# Patient Record
Sex: Male | Born: 1965 | Race: Black or African American | Hispanic: No | Marital: Married | Smoking: Current every day smoker
Health system: Southern US, Community
[De-identification: ages and names within clinical notes are randomized; demographics above are authoritative.]

## PROBLEM LIST (undated history)

## (undated) DIAGNOSIS — F1210 Cannabis abuse, uncomplicated: Secondary | ICD-10-CM

## (undated) DIAGNOSIS — F1410 Cocaine abuse, uncomplicated: Secondary | ICD-10-CM

## (undated) DIAGNOSIS — M1990 Unspecified osteoarthritis, unspecified site: Secondary | ICD-10-CM

## (undated) DIAGNOSIS — B20 Human immunodeficiency virus [HIV] disease: Secondary | ICD-10-CM

## (undated) DIAGNOSIS — F1010 Alcohol abuse, uncomplicated: Secondary | ICD-10-CM

---

## 1993-09-09 HISTORY — PX: CEREBRAL ANEURYSM REPAIR: SHX164

## 2008-04-14 ENCOUNTER — Encounter (INDEPENDENT_AMBULATORY_CARE_PROVIDER_SITE_OTHER): Payer: Self-pay | Admitting: Family Medicine

## 2008-04-14 ENCOUNTER — Ambulatory Visit: Payer: Self-pay | Admitting: Internal Medicine

## 2008-04-14 LAB — CONVERTED CEMR LAB
ALT: 21 units/L (ref 0–53)
AST: 15 units/L (ref 0–37)
Albumin: 4.4 g/dL (ref 3.5–5.2)
BUN: 12 mg/dL (ref 6–23)
Basophils Absolute: 0 10*3/uL (ref 0.0–0.1)
CO2: 23 meq/L (ref 19–32)
Calcium: 9.3 mg/dL (ref 8.4–10.5)
Chloride: 104 meq/L (ref 96–112)
Eosinophils Relative: 1 % (ref 0–5)
Glucose, Bld: 71 mg/dL (ref 70–99)
HCV Ab: REACTIVE — AB
HIV 1 RNA Quant: 2310 copies/mL — ABNORMAL HIGH (ref <50)
HIV-1 RNA Quant, Log: 3.36 — ABNORMAL HIGH (ref <1.70)
Lymphs Abs: 2.2 10*3/uL (ref 0.7–4.0)
MCV: 93.4 fL (ref 78.0–100.0)
Neutrophils Relative %: 40 % — ABNORMAL LOW (ref 43–77)
Platelets: 190 10*3/uL (ref 150–400)
RBC: 4.55 M/uL (ref 4.22–5.81)
Total Protein: 7.4 g/dL (ref 6.0–8.3)

## 2008-04-26 ENCOUNTER — Ambulatory Visit: Payer: Self-pay | Admitting: Internal Medicine

## 2008-04-26 LAB — CONVERTED CEMR LAB
CD4 T Helper %: 25 % — ABNORMAL LOW (ref 32–62)
Total lymphocyte count: 3025 cells/mcL (ref 700–3300)

## 2008-04-28 ENCOUNTER — Ambulatory Visit: Payer: Self-pay | Admitting: Internal Medicine

## 2008-05-28 ENCOUNTER — Emergency Department (HOSPITAL_COMMUNITY): Admission: EM | Admit: 2008-05-28 | Discharge: 2008-05-28 | Payer: Self-pay | Admitting: Emergency Medicine

## 2008-06-20 ENCOUNTER — Emergency Department (HOSPITAL_COMMUNITY): Admission: EM | Admit: 2008-06-20 | Discharge: 2008-06-20 | Payer: Self-pay | Admitting: Emergency Medicine

## 2008-07-07 ENCOUNTER — Ambulatory Visit: Payer: Self-pay | Admitting: Internal Medicine

## 2008-07-08 ENCOUNTER — Ambulatory Visit (HOSPITAL_COMMUNITY): Admission: RE | Admit: 2008-07-08 | Discharge: 2008-07-08 | Payer: Self-pay | Admitting: Internal Medicine

## 2008-07-12 ENCOUNTER — Ambulatory Visit: Payer: Self-pay | Admitting: Internal Medicine

## 2008-07-12 LAB — CONVERTED CEMR LAB
AST: 17 units/L (ref 0–37)
BUN: 9 mg/dL (ref 6–23)
Basophils Relative: 1 % (ref 0–1)
CD4 Count: 670 microliters
CD4 T Helper %: 31 % — ABNORMAL LOW (ref 32–62)
CO2: 30 meq/L (ref 19–32)
Calcium: 8.8 mg/dL (ref 8.4–10.5)
Chloride: 104 meq/L (ref 96–112)
Eosinophils Absolute: 0.2 10*3/uL (ref 0.0–0.7)
Eosinophils Relative: 4 % (ref 0–5)
Glucose, Bld: 70 mg/dL (ref 70–99)
HIV 1 RNA Quant: 6620 copies/mL — ABNORMAL HIGH (ref <48)
HIV-1 RNA Quant, Log: 3.82 — ABNORMAL HIGH (ref <1.68)
Lymphocytes Relative: 47 % — ABNORMAL HIGH (ref 12–46)
Lymphs Abs: 2.2 10*3/uL (ref 0.7–4.0)
Monocytes Absolute: 0.4 10*3/uL (ref 0.1–1.0)
Potassium: 4.1 meq/L (ref 3.5–5.3)
RDW: 14.6 % (ref 11.5–15.5)
Sodium: 141 meq/L (ref 135–145)
Total Lymphocytes %: 47 % — ABNORMAL HIGH (ref 12–46)
Total Protein: 7.2 g/dL (ref 6.0–8.3)
WBC: 4.6 10*3/uL (ref 4.0–10.5)

## 2008-07-28 ENCOUNTER — Ambulatory Visit: Payer: Self-pay | Admitting: Internal Medicine

## 2008-09-29 ENCOUNTER — Ambulatory Visit: Payer: Self-pay | Admitting: Internal Medicine

## 2008-09-29 LAB — CONVERTED CEMR LAB
AST: 18 units/L (ref 0–37)
Albumin: 4.2 g/dL (ref 3.5–5.2)
Alkaline Phosphatase: 76 units/L (ref 39–117)
BUN: 13 mg/dL (ref 6–23)
Calcium: 9.9 mg/dL (ref 8.4–10.5)
Chloride: 102 meq/L (ref 96–112)
Creatinine, Ser: 1.12 mg/dL (ref 0.40–1.50)
Eosinophils Relative: 1 % (ref 0–5)
HIV 1 RNA Quant: 4130 copies/mL — ABNORMAL HIGH (ref <48)
HIV-1 RNA Quant, Log: 3.62 — ABNORMAL HIGH (ref <1.68)
Neutro Abs: 1.9 10*3/uL (ref 1.7–7.7)
Potassium: 4.3 meq/L (ref 3.5–5.3)
Total Lymphocytes %: 55 % — ABNORMAL HIGH (ref 12–46)
Total lymphocyte count: 2695 cells/mcL (ref 700–3300)
WBC, lymph enumeration: 4.9 10*3/uL (ref 4.0–10.5)

## 2008-10-03 ENCOUNTER — Ambulatory Visit: Payer: Self-pay | Admitting: *Deleted

## 2008-10-27 ENCOUNTER — Encounter: Payer: Self-pay | Admitting: Internal Medicine

## 2008-11-03 ENCOUNTER — Encounter: Payer: Self-pay | Admitting: Internal Medicine

## 2008-12-05 ENCOUNTER — Ambulatory Visit: Payer: Self-pay | Admitting: Internal Medicine

## 2008-12-05 ENCOUNTER — Encounter: Payer: Self-pay | Admitting: Internal Medicine

## 2008-12-05 DIAGNOSIS — B20 Human immunodeficiency virus [HIV] disease: Secondary | ICD-10-CM | POA: Insufficient documentation

## 2008-12-05 DIAGNOSIS — K0500 Acute gingivitis, plaque induced: Secondary | ICD-10-CM | POA: Insufficient documentation

## 2008-12-05 DIAGNOSIS — M87059 Idiopathic aseptic necrosis of unspecified femur: Secondary | ICD-10-CM | POA: Insufficient documentation

## 2008-12-05 DIAGNOSIS — Z87820 Personal history of traumatic brain injury: Secondary | ICD-10-CM

## 2008-12-05 LAB — CONVERTED CEMR LAB
ALT: 42 units/L (ref 0–53)
AST: 23 units/L (ref 0–37)
Absolute CD4: 754 #/uL (ref 381–1469)
CD4 T Helper %: 27 % — ABNORMAL LOW (ref 32–62)
CO2: 26 meq/L (ref 19–32)
Calcium: 9.2 mg/dL (ref 8.4–10.5)
Chloride: 105 meq/L (ref 96–112)
Creatinine, Ser: 1.17 mg/dL (ref 0.40–1.50)
Eosinophils Absolute: 0.1 10*3/uL (ref 0.0–0.7)
Eosinophils Relative: 3 % (ref 0–5)
HCT: 43.2 % (ref 39.0–52.0)
HIV 1 RNA Quant: 3560 copies/mL — ABNORMAL HIGH (ref <48)
Hemoglobin: 14.9 g/dL (ref 13.0–17.0)
Lymphs Abs: 2.8 10*3/uL (ref 0.7–4.0)
MCHC: 34.5 g/dL (ref 30.0–36.0)
Neutro Abs: 1.6 10*3/uL — ABNORMAL LOW (ref 1.7–7.7)
RDW: 15.3 % (ref 11.5–15.5)
Total Bilirubin: 0.3 mg/dL (ref 0.3–1.2)
Total lymphocyte count: 2793 cells/mcL (ref 700–3300)
WBC, lymph enumeration: 4.9 10*3/uL (ref 4.0–10.5)

## 2008-12-20 ENCOUNTER — Encounter: Payer: Self-pay | Admitting: Internal Medicine

## 2009-01-19 ENCOUNTER — Ambulatory Visit: Payer: Self-pay | Admitting: Internal Medicine

## 2009-01-19 ENCOUNTER — Encounter: Payer: Self-pay | Admitting: Internal Medicine

## 2009-01-19 DIAGNOSIS — K029 Dental caries, unspecified: Secondary | ICD-10-CM | POA: Insufficient documentation

## 2009-01-26 ENCOUNTER — Encounter: Payer: Self-pay | Admitting: Internal Medicine

## 2009-01-26 ENCOUNTER — Ambulatory Visit: Payer: Self-pay | Admitting: Internal Medicine

## 2009-02-02 ENCOUNTER — Ambulatory Visit: Payer: Self-pay | Admitting: Internal Medicine

## 2009-02-02 ENCOUNTER — Encounter: Payer: Self-pay | Admitting: Internal Medicine

## 2009-02-16 ENCOUNTER — Encounter: Payer: Self-pay | Admitting: Internal Medicine

## 2009-03-22 ENCOUNTER — Encounter: Payer: Self-pay | Admitting: Internal Medicine

## 2009-06-12 ENCOUNTER — Ambulatory Visit: Payer: Self-pay | Admitting: Internal Medicine

## 2009-06-12 ENCOUNTER — Encounter: Payer: Self-pay | Admitting: Internal Medicine

## 2009-06-12 LAB — CONVERTED CEMR LAB
ALT: 31 units/L (ref 0–53)
AST: 24 units/L (ref 0–37)
BUN: 13 mg/dL (ref 6–23)
CO2: 25 meq/L (ref 19–32)
Chloride: 106 meq/L (ref 96–112)
Creatinine, Ser: 1.27 mg/dL (ref 0.40–1.50)
Eosinophils Relative: 2 % (ref 0–5)
HCT: 43.8 % (ref 39.0–52.0)
HIV 1 RNA Quant: 595 copies/mL — ABNORMAL HIGH (ref <48)
HIV-1 RNA Quant, Log: 2.77 — ABNORMAL HIGH (ref <1.68)
Platelets: 160 10*3/uL (ref 150–400)
Potassium: 4.2 meq/L (ref 3.5–5.3)
RDW: 15 % (ref 11.5–15.5)
Total Bilirubin: 0.3 mg/dL (ref 0.3–1.2)
Total Protein: 8.1 g/dL (ref 6.0–8.3)
Total lymphocyte count: 2907 cells/mcL (ref 700–3300)

## 2009-06-13 ENCOUNTER — Telehealth: Payer: Self-pay | Admitting: Internal Medicine

## 2009-06-27 ENCOUNTER — Encounter: Payer: Self-pay | Admitting: Internal Medicine

## 2009-07-25 ENCOUNTER — Encounter: Payer: Self-pay | Admitting: Internal Medicine

## 2009-07-31 ENCOUNTER — Encounter: Payer: Self-pay | Admitting: Internal Medicine

## 2009-07-31 ENCOUNTER — Ambulatory Visit: Payer: Self-pay | Admitting: Internal Medicine

## 2009-10-09 ENCOUNTER — Emergency Department (HOSPITAL_COMMUNITY): Admission: EM | Admit: 2009-10-09 | Discharge: 2009-10-09 | Payer: Self-pay | Admitting: Emergency Medicine

## 2010-04-03 ENCOUNTER — Ambulatory Visit: Payer: Self-pay | Admitting: Internal Medicine

## 2010-04-03 LAB — CONVERTED CEMR LAB
ALT: 14 units/L (ref 0–53)
AST: 17 units/L (ref 0–37)
Alkaline Phosphatase: 66 units/L (ref 39–117)
Basophils Absolute: 0 10*3/uL (ref 0.0–0.1)
Basophils Relative: 1 % (ref 0–1)
Calcium: 9 mg/dL (ref 8.4–10.5)
Hemoglobin: 13.2 g/dL (ref 13.0–17.0)
Lymphocytes Relative: 48 % — ABNORMAL HIGH (ref 12–46)
MCHC: 34.1 g/dL (ref 30.0–36.0)
MCV: 93.9 fL (ref 78.0–100.0)
Monocytes Relative: 9 % (ref 3–12)
Neutrophils Relative %: 40 % — ABNORMAL LOW (ref 43–77)
Platelets: 137 10*3/uL — ABNORMAL LOW (ref 150–400)
RBC: 4.12 M/uL — ABNORMAL LOW (ref 4.22–5.81)
RDW: 16.1 % — ABNORMAL HIGH (ref 11.5–15.5)
Total Protein: 7.3 g/dL (ref 6.0–8.3)

## 2010-10-09 NOTE — Miscellaneous (Signed)
Summary: Orders Update  Clinical Lists Changes  Orders: Added new Test order of T-CBC w/Diff [phone]) - Signed Added new Test order of T-CD4SP [facility]) (CD4SP) - Signed Added new Test order of T-Comprehensive Metabolic Panel [phone]) - Signed Added new Test order of T-HIV Viral Load [phone]) - Signed     Process Orders Check Orders Results:     [facility]: Check successful Tests Sent for requisitioning (April 03, 2010 9:11 AM):     04/03/2010: [facility] -- T-CBC w/Diff [[id number]] (signed)     04/03/2010: [facility] -- T-Comprehensive Metabolic Panel [[id number]] (signed)     04/03/2010: [facility] -- T-HIV Viral Load [phone] (signed)

## 2010-10-09 NOTE — Miscellaneous (Signed)
Summary: HIPAA Restrictions  HIPAA Restrictions   Imported By: Florinda Marker 04/03/2010 10:10:19  _____________________________________________________________________  External Attachment:    Type:   Image     Comment:   External Document

## 2010-11-24 LAB — T-HELPER CELL (CD4) - (RCID CLINIC ONLY)
CD4 % Helper T Cell: 29 % — ABNORMAL LOW (ref 33–55)
CD4 T Cell Abs: 530 uL (ref 400–2700)

## 2011-02-20 ENCOUNTER — Other Ambulatory Visit: Payer: Self-pay | Admitting: Adult Health

## 2011-02-20 DIAGNOSIS — B20 Human immunodeficiency virus [HIV] disease: Secondary | ICD-10-CM

## 2011-02-21 ENCOUNTER — Other Ambulatory Visit (INDEPENDENT_AMBULATORY_CARE_PROVIDER_SITE_OTHER): Payer: Medicare Other

## 2011-02-21 ENCOUNTER — Other Ambulatory Visit: Payer: Self-pay | Admitting: Infectious Diseases

## 2011-02-21 DIAGNOSIS — B20 Human immunodeficiency virus [HIV] disease: Secondary | ICD-10-CM

## 2011-02-21 DIAGNOSIS — Z113 Encounter for screening for infections with a predominantly sexual mode of transmission: Secondary | ICD-10-CM

## 2011-02-22 LAB — CBC WITH DIFFERENTIAL/PLATELET
Basophils Absolute: 0 10*3/uL (ref 0.0–0.1)
Basophils Relative: 1 % (ref 0–1)
Eosinophils Absolute: 0.2 10*3/uL (ref 0.0–0.7)
Eosinophils Relative: 3 % (ref 0–5)
HCT: 42.6 % (ref 39.0–52.0)
MCV: 93 fL (ref 78.0–100.0)
Monocytes Absolute: 0.4 10*3/uL (ref 0.1–1.0)
Monocytes Relative: 7 % (ref 3–12)
Neutrophils Relative %: 48 % (ref 43–77)

## 2011-02-22 LAB — COMPREHENSIVE METABOLIC PANEL
BUN: 17 mg/dL (ref 6–23)
Calcium: 9.9 mg/dL (ref 8.4–10.5)
Chloride: 107 mEq/L (ref 96–112)
Creat: 0.9 mg/dL (ref 0.50–1.35)
Glucose, Bld: 82 mg/dL (ref 70–99)
Potassium: 4.3 mEq/L (ref 3.5–5.3)
Total Bilirubin: 0.3 mg/dL (ref 0.3–1.2)

## 2011-02-22 LAB — T-HELPER CELL (CD4) - (RCID CLINIC ONLY): CD4 T Cell Abs: 700 uL (ref 400–2700)

## 2011-02-22 LAB — GC/CHLAMYDIA PROBE AMP, URINE: GC Probe Amp, Urine: NEGATIVE

## 2011-02-23 LAB — HIV-1 RNA QUANT-NO REFLEX-BLD
HIV 1 RNA Quant: 6480 copies/mL — ABNORMAL HIGH (ref <20)
HIV-1 RNA Quant, Log: 3.81 {Log} — ABNORMAL HIGH (ref <1.30)

## 2011-03-07 ENCOUNTER — Ambulatory Visit: Payer: Self-pay | Admitting: Adult Health

## 2011-03-22 ENCOUNTER — Ambulatory Visit (INDEPENDENT_AMBULATORY_CARE_PROVIDER_SITE_OTHER): Payer: Medicare Other | Admitting: Adult Health

## 2011-03-22 ENCOUNTER — Encounter: Payer: Self-pay | Admitting: Adult Health

## 2011-03-22 DIAGNOSIS — Z79899 Other long term (current) drug therapy: Secondary | ICD-10-CM

## 2011-03-22 DIAGNOSIS — I10 Essential (primary) hypertension: Secondary | ICD-10-CM

## 2011-03-22 DIAGNOSIS — B20 Human immunodeficiency virus [HIV] disease: Secondary | ICD-10-CM

## 2011-03-22 DIAGNOSIS — N390 Urinary tract infection, site not specified: Secondary | ICD-10-CM

## 2011-03-22 DIAGNOSIS — R636 Underweight: Secondary | ICD-10-CM

## 2011-03-22 DIAGNOSIS — Z113 Encounter for screening for infections with a predominantly sexual mode of transmission: Secondary | ICD-10-CM

## 2011-03-22 LAB — URINALYSIS, ROUTINE W REFLEX MICROSCOPIC
Protein, ur: NEGATIVE mg/dL
Specific Gravity, Urine: 1.026 (ref 1.005–1.030)
pH: 6 (ref 5.0–8.0)

## 2011-03-22 LAB — LIPID PANEL: VLDL: 28 mg/dL (ref 0–40)

## 2011-03-22 MED ORDER — CIPROFLOXACIN HCL 500 MG PO TABS: 500.0000 mg | ORAL_TABLET | Freq: Two times a day (BID) | ORAL | Status: AC

## 2011-03-22 MED ORDER — ENSURE PLUS PO LIQD: 1.0000 | Freq: Three times a day (TID) | ORAL | Status: DC

## 2011-03-22 NOTE — Progress Notes (Signed)
Subjective:    Patient ID: Michael [name], male    DOB: 10/25/1965, 45 y.o.   MRN: [id number]  HPI Presents to clinic for followup for the first time in nearly one year. Antiretroviral nave by history, he relates no known HIV associated sequela. 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 He has not followed up with orthopedics since last year as well. Claims that the pain seems to be worsening with increased difficulty in weightbearing, ambulation. Also, complains of dysuria, and increased urinary frequency, but only voiding small amounts. Denies frank hematuria, flank pain, or lower abdominal pain. Also reports ongoing problems with anorexia, which he also relates has been present. As long as he has had hip pain.  Review of Systems  Constitutional: Positive for activity change and appetite change. Negative for fever, chills, diaphoresis, fatigue and unexpected weight change.  HENT: Negative for hearing loss, ear pain, nosebleeds, congestion, sore throat, facial swelling, rhinorrhea, sneezing, drooling, mouth sores, trouble swallowing, neck pain, neck stiffness, dental problem, voice change, postnasal drip, sinus pressure, tinnitus and ear discharge.   Eyes: Negative for photophobia, pain, discharge, redness, itching and visual disturbance.  Respiratory: Negative for apnea, cough, choking, chest tightness, shortness of breath, wheezing and stridor.   Cardiovascular: Negative for chest pain, palpitations and leg swelling.  Gastrointestinal: Negative for nausea, vomiting, abdominal pain, diarrhea, constipation, blood in stool, abdominal distention, anal bleeding and rectal pain.  Genitourinary: Positive for dysuria, urgency, frequency and decreased urine volume. Negative for hematuria, flank pain, discharge, penile swelling, scrotal swelling, enuresis, difficulty urinating, genital sores, penile pain and testicular  pain.  Musculoskeletal: Positive for myalgias, arthralgias and gait problem. Negative for back pain and joint swelling.  Skin: Negative for color change, pallor, rash and wound.  Neurological: Negative for dizziness, tremors, seizures, syncope, facial asymmetry, speech difficulty, weakness, light-headedness, numbness and headaches.  Hematological: Negative for adenopathy. Does not bruise/bleed easily.  Psychiatric/Behavioral: Negative for suicidal ideas, hallucinations, behavioral problems, confusion, sleep disturbance, self-injury, dysphoric mood, decreased concentration and agitation. The patient is not nervous/anxious and is not hyperactive.        Objective:   Physical Exam  Constitutional: He is oriented to person, place, and time. He appears well-developed. No distress.       Underweight-appearing  HENT:  Head: Normocephalic and atraumatic.  Right Ear: External ear normal.  Left Ear: External ear normal.  Nose: Nose normal.  Mouth/Throat: No oropharyngeal exudate.  Eyes: Conjunctivae and EOM are normal. Pupils are equal, round, and reactive to light. Right eye exhibits no discharge. Left eye exhibits no discharge. No scleral icterus.  Neck: Normal range of motion. Neck supple. No JVD present. No tracheal deviation present. No thyromegaly present.  Cardiovascular: Normal rate, regular rhythm, normal heart sounds and intact distal pulses.   Pulmonary/Chest: Effort normal and breath sounds normal. No stridor. No respiratory distress. He has no wheezes. He has no rales. He exhibits no tenderness.  Abdominal: Soft. Bowel sounds are normal. He exhibits no distension and no mass. There is no tenderness. There is no rebound and no guarding.  Genitourinary: Rectum normal and penis normal. No penile tenderness.  Musculoskeletal: He exhibits tenderness. He exhibits no edema.       Limited flexion and abduction to left hip. Ambulates with limited weightbearing, favoring the left side, and using a  cane.  Lymphadenopathy:    He has no cervical adenopathy.  Neurological: He is  alert and oriented to person, place, and time. No cranial nerve deficit. He exhibits normal muscle tone. Coordination normal.  Skin: Skin is warm. No rash noted. He is not diaphoretic. No erythema. No pallor.  Psychiatric: He has a normal mood and affect. His behavior is normal. Judgment and thought content normal.          Assessment & Plan:  1. HIV. Labs obtained 02/21/2011. Show a CD4 count of 700 at 28% with a viral load of 6480 copies per mL. While his CD4 count continues to remain above 500, he does demonstrate, albeit small, a continued presence of viral replication. Given his current history that also includes AVN of the hip, we should discuss in detail, perhaps, measures that would include starting antiretroviral therapy. Reasoning/rationale for early initiation of therapy, and the relative to many factors, as was discussed with him, including some of the present. Comorbidities that he currently has, as well as the fact he is in a discordant relationship with his wife. We should obtain an HIV genotype today as well as obtaining lipids for baseline evaluation before discussing possible treatment options for him. He seemed quite receptive to the possibility of treatment and was willing to engage in conversation regarding this. On followup. We will ask that he return to clinic in 2 weeks for further discussion.  2. UTI. Urine dipstick showed leukocytes and blood with a protein of 30 in the urine. We will send his urine sample to obtain a formal UA and urine C&S, as well as a GC and Chlamydia. Meanwhile, we will treat him with Cipro 500 mg by mouth every 12 hours x10 days. Also, instructed him to increase intake of cranberry juice, and other fluids.  3. AVN, Left Hip. He was instructed to contact his orthopedist for a followup evaluation and possible evaluation for surgical intervention. I discussed with him that if  his symptoms continue to worsen and no intervention is done, he may eventually become wheelchair-bound.  4. Anorexia/Underweight Appearance. Ensure one can by mouth 3 times a day between meals.  He verbally acknowledged all information was provided to him and agreed with plan of care.

## 2011-03-23 LAB — URINALYSIS, MICROSCOPIC ONLY
Bacteria, UA: NONE SEEN
Squamous Epithelial / LPF: NONE SEEN

## 2011-03-25 LAB — HIV-1 RNA ULTRAQUANT REFLEX TO GENTYP+: HIV 1 RNA Quant: 8620 copies/mL — ABNORMAL HIGH (ref <20)

## 2011-04-02 LAB — HIV-1 GENOTYPR PLUS

## 2011-04-05 ENCOUNTER — Ambulatory Visit: Payer: Medicare Other | Admitting: Adult Health

## 2011-05-06 ENCOUNTER — Encounter: Payer: Self-pay | Admitting: Adult Health

## 2011-05-06 ENCOUNTER — Ambulatory Visit: Payer: Medicare Other | Admitting: Adult Health

## 2011-05-06 ENCOUNTER — Ambulatory Visit (INDEPENDENT_AMBULATORY_CARE_PROVIDER_SITE_OTHER): Payer: Medicare Other | Admitting: Adult Health

## 2011-05-06 VITALS — BP 117/73 | HR 66 | Temp 97.8°F | Wt 159.0 lb

## 2011-05-06 DIAGNOSIS — K069 Disorder of gingiva and edentulous alveolar ridge, unspecified: Secondary | ICD-10-CM

## 2011-05-06 DIAGNOSIS — B20 Human immunodeficiency virus [HIV] disease: Secondary | ICD-10-CM

## 2011-05-06 DIAGNOSIS — L259 Unspecified contact dermatitis, unspecified cause: Secondary | ICD-10-CM

## 2011-05-06 DIAGNOSIS — Z23 Encounter for immunization: Secondary | ICD-10-CM

## 2011-05-06 DIAGNOSIS — K056 Periodontal disease, unspecified: Secondary | ICD-10-CM

## 2011-05-06 MED ORDER — KETOCONAZOLE 2 % EX CREA: TOPICAL_CREAM | Freq: Two times a day (BID) | CUTANEOUS | Status: AC

## 2011-05-06 MED ORDER — HYDROCORTISONE 1 % EX CREA: TOPICAL_CREAM | CUTANEOUS | Status: AC

## 2011-05-06 MED ORDER — EMTRICITAB-RILPIVIR-TENOFOV DF 200-25-300 MG PO TABS: 1.0000 | ORAL_TABLET | Freq: Every day | ORAL | Status: DC

## 2011-05-06 MED ORDER — CHLORHEXIDINE GLUCONATE 0.12 % MT SOLN: OROMUCOSAL | Status: DC

## 2011-05-06 NOTE — Patient Instructions (Signed)
1. Take Complera one tablet by mouth daily, with a 400-600-calorie meal. 2. Return to clinic in 4 weeks for repeat labs. 3. Scheduled followup visit in 6 weeks. 4. Do not eat or drink anything for 30 minutes after using mouth wash. 5. Wash clothes in fragrance-free and dye-free detergent. 6. Wear cotton clothing not synthetic fibers.  Gingivitis Gingivitis is a form of gum (periodontal) disease that causes swelling (inflammation) of your gums. Gingivitis means there is an inflammation (soreness) of the gingiva. The gingiva is the soft tissue around the tooth and attached to the sides of the tooth. When these tissues become inflamed, they bleed easily, especially during flossing or brushing. The gums may also become tender and inflamed (red and sore). This may cause bad breath. Continued infection around the tooth can eventually cause cavities and loosening of teeth, with the eventual loss of teeth. The cause of this disease is plaque which is a build up of bacteria (germs) and sugar. As plaque builds up, it reacts with the saliva in your mouth to form something called calculus around the tooth. This substance is irritating to the gums and causes the gingivitis. When this disease process is allowed to continue it eventually causes cavities in teeth and loss of teeth. Prevention of this problem is the best treatment. Regular dental check ups and cleaning help with prevention. PREVENTION  Brush at least twice a day and floss at least once per day. Ideally you should brush following each meal. When flossing, it is best to floss first, then brush.   Limit the sugars eaten between meals.   If your infant sleeps with a bottle, use only water. Juices and sugars while sleeping help promote tooth decay and gingivitis.   Even the best dental hygiene will not prevent plaque from developing. It is necessary for you to see your dental hygienist or dentist on a regular basis as suggested by them for cleaning and  regular checkups. As noted above, keeping your teeth free of plaque will help prevent gingivitis and cavities.   Your dental caregivers can recommend proper oral hygiene and mouth care for you to follow, as well as suggest special tooth pastes.  SEEK DENTAL AND OR MEDICAL CARE IF:  You have painful, reddened tissue around your teeth   You have difficulty chewing.   You notice any loose or infected teeth.  Document Released: 02/19/2001 Document Re-Released: 11/22/2008 [facility] Patient Information 2011 [place], [place].  Contact Dermatitis Contact dermatitis is a reaction to certain substances that touch the skin. The substances may irritate the skin or cause an allergic reaction. Many substances can cause contact dermatitis. Common causes are cosmetics, jewelry, soaps, solvents and any number of chemicals. The area of skin that is exposed becomes dry, red, cracked, and itchy, and looks like a rash. In severe cases, blisters may develop. Symptoms (problems) can be controlled with treatment and by avoiding substances that caused the reaction. TREATMENT AND PREVENTION MEASURES  Keep the area of skin that is affected away from hot water, soap, sunlight, chemicals, acidic substances, or anything else that would irritate your skin. Do not rub the skin.   Use medications as directed.   You may use:   A topical steroid to reduce inflammation (redness or soreness) and anti-bacterial (germ) ointments for secondary infections.   Lubricants to keep moisture in your skin.   Burrow's solution to reduce inflammation or dry rash if weeping. Mix one packet or tablet in 2 cups cool water. Dip a clean  washcloth in the mixture, wring it out a bit, and put it on the affected area. Leave in place for 30 minutes, then re-soak the washcloth and apply again. Do this as often as possible throughout the day.   If the area is too large to cover with a wash cloth, take several cornstarch or baking soda baths daily.     You may want to rest affected areas until less sore.  SEEK IMMEDIATE MEDICAL CARE IF:  An oral temperature above 102 develops.   You see signs of infection, such as swelling, tenderness, inflammation (redness or soreness), or warmth of affected area.   Treatment does not relieve your symptoms within 2 days, or as suggested by your caregiver.   You have any problems related to medication used.  Document Released: 08/23/2000 Document Re-Released: 02/13/2010 [facility] Patient Information 2011 [place], [place].

## 2011-06-04 ENCOUNTER — Emergency Department: Payer: Self-pay | Admitting: Emergency Medicine

## 2011-06-10 ENCOUNTER — Other Ambulatory Visit: Payer: Self-pay | Admitting: *Deleted

## 2011-06-10 DIAGNOSIS — B20 Human immunodeficiency virus [HIV] disease: Secondary | ICD-10-CM

## 2011-06-10 MED ORDER — EMTRICITAB-RILPIVIR-TENOFOV DF 200-25-300 MG PO TABS: 1.0000 | ORAL_TABLET | Freq: Every day | ORAL | Status: DC

## 2011-06-10 NOTE — Telephone Encounter (Signed)
Patient called and advised he now has his ADAP and is using [facility] and to send his meds there.

## 2011-06-11 LAB — URINALYSIS, ROUTINE W REFLEX MICROSCOPIC
Protein, ur: NEGATIVE
Urobilinogen, UA: 0.2

## 2011-07-18 ENCOUNTER — Other Ambulatory Visit: Payer: Medicare Other

## 2011-08-13 ENCOUNTER — Other Ambulatory Visit: Payer: Self-pay | Admitting: Internal Medicine

## 2011-08-13 DIAGNOSIS — B20 Human immunodeficiency virus [HIV] disease: Secondary | ICD-10-CM

## 2011-08-15 ENCOUNTER — Ambulatory Visit: Payer: Medicare Other | Admitting: Internal Medicine

## 2011-08-15 ENCOUNTER — Other Ambulatory Visit: Payer: Medicare Other

## 2011-08-28 ENCOUNTER — Other Ambulatory Visit: Payer: Medicare Other

## 2011-08-29 ENCOUNTER — Ambulatory Visit: Payer: Medicare Other | Admitting: Internal Medicine

## 2011-09-12 ENCOUNTER — Telehealth: Payer: Self-pay | Admitting: *Deleted

## 2011-09-12 ENCOUNTER — Encounter: Payer: Medicare Other | Admitting: Internal Medicine

## 2011-09-12 NOTE — Telephone Encounter (Signed)
Phoned patient to inform him he missed his visit with Dr. Drue Second. Was unable to reach him or leave a message due to mailbox being full. Tacey Heap RN

## 2011-09-25 ENCOUNTER — Other Ambulatory Visit (INDEPENDENT_AMBULATORY_CARE_PROVIDER_SITE_OTHER): Payer: Medicare Other

## 2011-09-25 ENCOUNTER — Other Ambulatory Visit: Payer: Self-pay | Admitting: Infectious Diseases

## 2011-09-25 DIAGNOSIS — B20 Human immunodeficiency virus [HIV] disease: Secondary | ICD-10-CM

## 2011-09-25 LAB — CBC WITH DIFFERENTIAL/PLATELET
Basophils Absolute: 0 10*3/uL (ref 0.0–0.1)
Eosinophils Absolute: 0.1 10*3/uL (ref 0.0–0.7)
Lymphocytes Relative: 48 % — ABNORMAL HIGH (ref 12–46)
Lymphs Abs: 2.7 10*3/uL (ref 0.7–4.0)
Neutrophils Relative %: 43 % (ref 43–77)
Platelets: 234 10*3/uL (ref 150–400)
RBC: 4.33 MIL/uL (ref 4.22–5.81)
WBC: 5.6 10*3/uL (ref 4.0–10.5)

## 2011-09-25 LAB — COMPREHENSIVE METABOLIC PANEL
ALT: 21 U/L (ref 0–53)
AST: 27 U/L (ref 0–37)
CO2: 23 mEq/L (ref 19–32)
Chloride: 106 mEq/L (ref 96–112)
Sodium: 137 mEq/L (ref 135–145)
Total Bilirubin: 0.4 mg/dL (ref 0.3–1.2)
Total Protein: 7 g/dL (ref 6.0–8.3)

## 2011-09-26 LAB — T-HELPER CELL (CD4) - (RCID CLINIC ONLY): CD4 T Cell Abs: 910 uL (ref 400–2700)

## 2011-09-27 LAB — HIV-1 RNA QUANT-NO REFLEX-BLD
HIV 1 RNA Quant: <20 copies/mL (ref <20)
HIV-1 RNA Quant, Log: <1.3 {Log} (ref <1.30)

## 2011-10-09 ENCOUNTER — Ambulatory Visit: Payer: Medicare Other | Admitting: Infectious Disease

## 2011-10-10 ENCOUNTER — Other Ambulatory Visit: Payer: Self-pay | Admitting: Infectious Disease

## 2011-10-10 ENCOUNTER — Encounter: Payer: Self-pay | Admitting: Infectious Disease

## 2011-10-10 ENCOUNTER — Ambulatory Visit (INDEPENDENT_AMBULATORY_CARE_PROVIDER_SITE_OTHER): Payer: Medicare Other | Admitting: Infectious Disease

## 2011-10-10 VITALS — BP 100/65 | HR 64 | Temp 97.9°F | Wt 150.0 lb

## 2011-10-10 DIAGNOSIS — B079 Viral wart, unspecified: Secondary | ICD-10-CM | POA: Insufficient documentation

## 2011-10-10 DIAGNOSIS — B20 Human immunodeficiency virus [HIV] disease: Secondary | ICD-10-CM

## 2011-10-10 DIAGNOSIS — E559 Vitamin D deficiency, unspecified: Secondary | ICD-10-CM

## 2011-10-10 DIAGNOSIS — M87059 Idiopathic aseptic necrosis of unspecified femur: Secondary | ICD-10-CM

## 2011-10-10 MED ORDER — IMIQUIMOD 5 % EX CREA: TOPICAL_CREAM | CUTANEOUS | Status: DC

## 2011-10-10 MED ORDER — ALENDRONATE SODIUM 70 MG PO TABS: 70.0000 mg | ORAL_TABLET | ORAL | Status: DC

## 2011-10-10 MED ORDER — OXYCODONE-ACETAMINOPHEN 10-325 MG PO TABS: 1.0000 | ORAL_TABLET | Freq: Every evening | ORAL | Status: AC | PRN

## 2011-10-10 MED ORDER — CALCIUM CARBONATE-VITAMIN D 600-400 MG-UNIT PO CHEW: 1.0000 | CHEWABLE_TABLET | Freq: Every day | ORAL | Status: DC

## 2011-10-10 NOTE — Assessment & Plan Note (Signed)
Continue complera 

## 2011-10-10 NOTE — Progress Notes (Signed)
  Subjective:    Patient ID: Michael [name], male    DOB: August 07, 1966, 46 y.o.   MRN: [id number]  HPI  Michael [name] is a 46 y.o. male who is doing superbly well on his antiviral regimen, with undetectable viral load and health cd4 count. He is on complera and understands ALL of requirements in terms of diet, avoidance of antacids etc. 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 He requests med for pain control. I spent greater than 45 minutes with the patient including greater than 50% of time in face to face counsel of the patient and in coordination of their care.    Review of Systems  Constitutional: Negative for fever, chills, diaphoresis, activity change, appetite change, fatigue and unexpected weight change.  HENT: Negative for congestion, sore throat, rhinorrhea, sneezing, trouble swallowing and sinus pressure.   Eyes: Negative for photophobia and visual disturbance.  Respiratory: Negative for cough, chest tightness, shortness of breath, wheezing and stridor.   Cardiovascular: Negative for chest pain, palpitations and leg swelling.  Gastrointestinal: Negative for nausea, vomiting, abdominal pain, diarrhea, constipation, blood in stool, abdominal distention and anal bleeding.  Genitourinary: Negative for dysuria, hematuria, flank pain and difficulty urinating.  Musculoskeletal: Positive for myalgias, arthralgias and gait problem. Negative for back pain and joint swelling.  Skin: Negative for color change, pallor, rash and wound.  Neurological: Negative for dizziness, tremors, weakness and light-headedness.  Hematological: Negative for adenopathy. Does not bruise/bleed easily.  Psychiatric/Behavioral: Negative for behavioral problems, confusion, sleep disturbance, dysphoric mood, decreased concentration and agitation.       Objective:   Physical Exam  Constitutional: He is oriented to person, place, and time. He appears  well-developed and well-nourished. No distress.  HENT:  Head: Normocephalic and atraumatic.  Mouth/Throat: Oropharynx is clear and moist. No oropharyngeal exudate.  Eyes: Conjunctivae and EOM are normal. Pupils are equal, round, and reactive to light. No scleral icterus.  Neck: Normal range of motion. Neck supple. No JVD present.  Cardiovascular: Normal rate, regular rhythm and normal heart sounds.  Exam reveals no gallop and no friction rub.   No murmur heard. Pulmonary/Chest: Effort normal and breath sounds normal. No respiratory distress. He has no wheezes. He has no rales. He exhibits no tenderness.  Abdominal: He exhibits no distension and no mass. There is no tenderness. There is no rebound and no guarding.  Musculoskeletal: He exhibits no edema and no tenderness.       Right hip: He exhibits tenderness.  Lymphadenopathy:    He has no cervical adenopathy.  Neurological: He is alert and oriented to person, place, and time. He has normal reflexes. He exhibits normal muscle tone. Coordination normal.  Skin: Skin is warm and dry. He is not diaphoretic. No erythema. No pallor.  Psychiatric: He has a normal mood and affect. His behavior is normal. Judgment and thought content normal.          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 WIll put him on fosamax, vitamin D and calcium. Will check testosterone levels

## 2011-10-10 NOTE — Assessment & Plan Note (Signed)
Due to steroids AND HIV likely. WIll put him on fosamax, vitamin D and calcium. Will check testosterone levels

## 2011-10-21 DIAGNOSIS — L259 Unspecified contact dermatitis, unspecified cause: Secondary | ICD-10-CM | POA: Insufficient documentation

## 2011-10-21 DIAGNOSIS — K056 Periodontal disease, unspecified: Secondary | ICD-10-CM | POA: Insufficient documentation

## 2011-10-21 NOTE — Assessment & Plan Note (Signed)
Will try chlorhexidine gluconate 0.12% swish and spit twice a day and make a referral to see a dentist.

## 2011-10-21 NOTE — Progress Notes (Signed)
Subjective:    Patient ID: Michael [name] is a 46 y.o. male.  Chief Complaint: HIV Follow-up Visit Michael [name] is here for follow-up of HIV infection. He is feeling unchanged since his last visit.  He claims continued adherence to therapy with good tolerance and no complications. There are additional complaints. Complains of rash on hands, and arms. Also, complains of oropharyngeal pain, and discomfort.  Data Review: Diagnostic studies reviewed.  Review of Systems - General ROS: negative Psychological ROS: negative Ophthalmic ROS: negative ENT ROS: positive for - as per history of present illness Respiratory ROS: no cough, shortness of breath, or wheezing Cardiovascular ROS: no chest pain or dyspnea on exertion Gastrointestinal ROS: no abdominal pain, change in bowel habits, or black or bloody stools Musculoskeletal ROS: negative Neurological ROS: no TIA or stroke symptoms Dermatological ROS: negative for rash  Objective:   General appearance: alert, cooperative and no distress Head: Normocephalic, without obvious abnormality, atraumatic Eyes: conjunctivae/corneas clear. PERRL, EOM's intact. Fundi benign. Ears: normal TM's and external ear canals both ears Throat: Poor dentition, with significant gingival hyperplasia and erythema at the gingival line. Resp: clear to auscultation bilaterally Cardio: regular rate and rhythm, S1, S2 normal, no murmur, click, rub or gallop GI: soft, non-tender; bowel sounds normal; no masses,  no organomegaly Skin: Macrotrabecular rash noted to hands, and arms. Neurologic: Grossly normal Psych:  No vegetative signs or delusional behaviors noted.    Laboratory: From 02/21/2011 ,  CD4 count was 700 c/cmm @ 28 %. Viral load 6480 copies/ml. From 03/22/2011. HIV genotype was pansensitive are consistent with wild-type virus.     Assessment/Plan:   Contact dermatitis Will try hydrocortisone 1% cream, and ketoconazole 2% cream to be applied twice a  day to affected areas.  Periodontal disease Will try chlorhexidine gluconate 0.12% swish and spit twice a day and make a referral to see a dentist.  HIV DISEASE He has been treatment nave for his HIV, up until now. We discussed in detail the changes that have been made in treatment guidelines and that treatment is recommended for all individuals with a detectable viral load. Regardless a CD4 count. After discussing this, he understood the need to begin therapy knowing that it had nothing to do with any and immunologic deterioration. We reviewed briefly HIV. Pathogenesis, and the various treatment regimens. After careful discussion it was mutually agreed upon to begin Complera therapy.  Medication regimen,drug effects, treatment limitations, side effects, ADRs, and potential toxicities discussed in detail. Medication adherence, its importance in therapy and resistance discussed with patient. Counseling provided on prevention of transmission of HIV. Condoms offered:  Yes Referrals: None Follow up visit in 6 weeks with labs 2 weeks prior to appointment. Patient verbally acknowledged information provided to them and agreed with plan of care.      Michael [name] A. Sundra Aland, MS, [facility] [phone]  10/21/2011, 9:58 PM

## 2011-10-21 NOTE — Assessment & Plan Note (Signed)
He has been treatment nave for his HIV, up until now. We discussed in detail the changes that have been made in treatment guidelines and that treatment is recommended for all individuals with a detectable viral load. Regardless a CD4 count. After discussing this, he understood the need to begin therapy knowing that it had nothing to do with any and immunologic deterioration. We reviewed briefly HIV. Pathogenesis, and the various treatment regimens. After careful discussion it was mutually agreed upon to begin Complera therapy.  Medication regimen,drug effects, treatment limitations, side effects, ADRs, and potential toxicities discussed in detail. Medication adherence, its importance in therapy and resistance discussed with patient. Counseling provided on prevention of transmission of HIV. Condoms offered:  Yes Referrals: None Follow up visit in 6 weeks with labs 2 weeks prior to appointment. Patient verbally acknowledged information provided to them and agreed with plan of care.

## 2011-10-21 NOTE — Assessment & Plan Note (Signed)
Will try hydrocortisone 1% cream, and ketoconazole 2% cream to be applied twice a day to affected areas.

## 2011-12-10 ENCOUNTER — Ambulatory Visit: Payer: Medicare Other | Admitting: Internal Medicine

## 2011-12-10 ENCOUNTER — Telehealth: Payer: Self-pay | Admitting: *Deleted

## 2011-12-10 ENCOUNTER — Encounter: Payer: Self-pay | Admitting: *Deleted

## 2011-12-10 NOTE — Telephone Encounter (Signed)
Unable to leave message. Will send a letter

## 2011-12-25 ENCOUNTER — Other Ambulatory Visit: Payer: Medicare Other

## 2012-01-08 ENCOUNTER — Telehealth: Payer: Self-pay | Admitting: Licensed Clinical Social Worker

## 2012-01-08 ENCOUNTER — Ambulatory Visit: Payer: Medicare Other | Admitting: Infectious Disease

## 2012-01-08 NOTE — Telephone Encounter (Signed)
Patient did not keep lab or office visit appointment. I called and left a message on voicemail for him to call the office to reschedule.

## 2012-04-15 ENCOUNTER — Ambulatory Visit (HOSPITAL_COMMUNITY): Admission: RE | Admit: 2012-04-15 | Payer: Medicare Other | Source: Home / Self Care | Admitting: Psychiatry

## 2012-04-20 ENCOUNTER — Telehealth: Payer: Self-pay | Admitting: *Deleted

## 2012-04-20 NOTE — Telephone Encounter (Signed)
Patient called c/o pain from rectal warts, has not been seen since January.  He will need lab work also. Given appt tomorrow with Dr. Drue Second. Wendall Mola CMA

## 2012-04-21 ENCOUNTER — Ambulatory Visit: Payer: Self-pay | Admitting: Internal Medicine

## 2012-04-24 ENCOUNTER — Encounter: Payer: Self-pay | Admitting: Internal Medicine

## 2012-04-24 ENCOUNTER — Other Ambulatory Visit: Payer: Self-pay | Admitting: *Deleted

## 2012-04-24 ENCOUNTER — Other Ambulatory Visit: Payer: Self-pay | Admitting: Internal Medicine

## 2012-04-24 ENCOUNTER — Ambulatory Visit (INDEPENDENT_AMBULATORY_CARE_PROVIDER_SITE_OTHER): Payer: Medicare Other | Admitting: Internal Medicine

## 2012-04-24 VITALS — BP 109/71 | HR 64 | Temp 97.5°F | Wt 141.0 lb

## 2012-04-24 DIAGNOSIS — R636 Underweight: Secondary | ICD-10-CM

## 2012-04-24 DIAGNOSIS — B20 Human immunodeficiency virus [HIV] disease: Secondary | ICD-10-CM

## 2012-04-24 DIAGNOSIS — Z21 Asymptomatic human immunodeficiency virus [HIV] infection status: Secondary | ICD-10-CM

## 2012-04-24 DIAGNOSIS — M8700 Idiopathic aseptic necrosis of unspecified bone: Secondary | ICD-10-CM

## 2012-04-24 LAB — CBC WITH DIFFERENTIAL/PLATELET
Eosinophils Relative: 2 % (ref 0–5)
Lymphocytes Relative: 23 % (ref 12–46)
Lymphs Abs: 1.9 10*3/uL (ref 0.7–4.0)
MCV: 96.7 fL (ref 78.0–100.0)
Neutrophils Relative %: 68 % (ref 43–77)
Platelets: 263 10*3/uL (ref 150–400)
RBC: 4.52 MIL/uL (ref 4.22–5.81)
WBC: 8.3 10*3/uL (ref 4.0–10.5)

## 2012-04-24 LAB — COMPREHENSIVE METABOLIC PANEL
ALT: 17 U/L (ref 0–53)
Albumin: 4.5 g/dL (ref 3.5–5.2)
CO2: 29 mEq/L (ref 19–32)
Calcium: 9.5 mg/dL (ref 8.4–10.5)
Chloride: 100 mEq/L (ref 96–112)
Potassium: 3.6 mEq/L (ref 3.5–5.3)
Sodium: 137 mEq/L (ref 135–145)
Total Protein: 7.1 g/dL (ref 6.0–8.3)

## 2012-04-24 MED ORDER — OXYCODONE-ACETAMINOPHEN 10-325 MG PO TABS: 1.0000 | ORAL_TABLET | Freq: Four times a day (QID) | ORAL | Status: AC | PRN

## 2012-04-24 MED ORDER — ENSURE PLUS PO LIQD: 1.0000 | Freq: Three times a day (TID) | ORAL | Status: DC

## 2012-04-24 MED ORDER — ENSURE PO LIQD: 237.0000 mL | Freq: Three times a day (TID) | ORAL | Status: DC

## 2012-04-24 NOTE — Progress Notes (Addendum)
Subjective:    Patient ID: Rosalio Catterton, male    DOB: 1966-06-11, 46 y.o.   MRN: [id number]  HPI Nissim, Fleischer 46yo M with HIV, CD 4 count of 910, VL < 20, on complera.  and understands ALL of requirements in terms of diet, avoidance of antacids etc.He is worried that he hasn't had enough food to eat 3 meals per day due to financial constraints. His wife has moved out and he is responsible for all their household bills. 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 He states that he has difficulty with lifting his right leg. Uses a cane to ambulate. Sharp, throbbing pain to right hip.  In February, was started on fosamax, calcium supplement .  Current Outpatient Prescriptions on File Prior to Visit  Medication Sig Dispense Refill  . alendronate (FOSAMAX) 70 MG tablet Take 1 tablet (70 mg total) by mouth every 7 (seven) days. Take with a full glass of water on an empty stomach.  4 tablet  11  . Calcium Carbonate-Vitamin D 600-400 MG-UNIT per chew tablet Chew 1 tablet by mouth daily.  30 tablet  11  . chlorhexidine (PERIDEX) 0.12 % solution Swish and spit 15 mL (3 teaspoons.) Twice a day as directed  480 mL  2  . Emtricitab-Rilpivir-Tenofovir 200-25-300 MG TABS Take 1 tablet by mouth daily. Take with a 400-600-calorie meal  30 tablet  11  . Ensure Plus (ENSURE PLUS) LIQD Take 1 Can by mouth 3 (three) times daily between meals.  48 Can  5  . hydrocortisone 1 % cream Apply to affected area 2 times daily as directed  60 g  1  . imiquimod (ALDARA) 5 % cream Apply topically as directed. Apply 5 times a week for 4 months  24 each  3  . ketoconazole (NIZORAL) 2 % cream Apply topically 2 (two) times daily. As directed  60 g  1     Review of Systems  Constitutional: Negative for fever, chills, diaphoresis, activity change, appetite change, fatigue and unexpected weight change.  HENT: Negative for congestion, sore throat, rhinorrhea, sneezing, trouble  swallowing and sinus pressure.  Eyes: Negative for photophobia and visual disturbance.  Respiratory: Negative for cough, chest tightness, shortness of breath, wheezing and stridor.  Cardiovascular: Negative for chest pain, palpitations and leg swelling.  Gastrointestinal: Negative for nausea, vomiting, abdominal pain, diarrhea, constipation, blood in stool, abdominal distention and anal bleeding.  Genitourinary: Negative for dysuria, hematuria, flank pain and difficulty urinating.  Musculoskeletal: per hpi Skin: Negative for color change, pallor, rash and wound.  Neurological: Negative for dizziness, tremors, weakness and light-headedness.  Hematological: Negative for adenopathy. Does not bruise/bleed easily.  Psychiatric/Behavioral: Negative for behavioral problems, confusion, sleep disturbance, dysphoric mood, decreased concentration and agitation.       Objective:   Physical Exam  BP 109/71  Pulse 64  Temp 97.5 F (36.4 C) (Oral)  Wt 141 lb (63.957 kg) Physical Exam  Constitutional: He is oriented to person, place, and time. He appears well-developed and well-nourished. No distress.  HENT:  Mouth/Throat: Oropharynx is clear and moist. No oropharyngeal exudate.  Cardiovascular: Normal rate, regular rhythm and normal heart sounds. Exam reveals no gallop and no friction rub.  No murmur heard.  Pulmonary/Chest: Effort normal and breath sounds normal. No respiratory distress. He has no wheezes.  Abdominal: Soft. Bowel sounds are normal. He exhibits no distension. There is no tenderness.  Lymphadenopathy:  He has no  cervical adenopathy.  Neurological: He is alert and oriented to person, place, and time.  Skin: Skin is warm and dry. No rash noted. No erythema.  Psychiatric: He has a normal mood and affect. His behavior is normal.  Ext: right hip tender at greater tronchacteric area. 3/5 quad strength; hip flexion.      Assessment & Plan:   hiv = check labs today; has had great  immunologic and virologic control, undetectable viral load. patient reports food security issues. Concern that he may not be getting sufficient calories with complera. Will change ART if having detectable viral load  AVN = will  Give rx for pain medication, and refer to [facility] for evaluation  Malnutrition = ensure rx for THP to give him fluid   Personal life issues = getting separated/divorced. His wife has moved out.

## 2012-04-27 ENCOUNTER — Other Ambulatory Visit: Payer: Self-pay

## 2012-04-27 ENCOUNTER — Ambulatory Visit: Payer: Self-pay | Admitting: Internal Medicine

## 2012-04-27 LAB — HIV-1 RNA QUANT-NO REFLEX-BLD: HIV 1 RNA Quant: <20 copies/mL (ref <20)

## 2012-05-10 ENCOUNTER — Emergency Department (HOSPITAL_COMMUNITY): Payer: Medicare Other

## 2012-05-10 ENCOUNTER — Encounter (HOSPITAL_COMMUNITY): Payer: Self-pay | Admitting: Cardiology

## 2012-05-10 ENCOUNTER — Emergency Department (HOSPITAL_COMMUNITY)
Admission: EM | Admit: 2012-05-10 | Discharge: 2012-05-10 | Payer: Medicare Other | Attending: Emergency Medicine | Admitting: Emergency Medicine

## 2012-05-10 DIAGNOSIS — S0180XA Unspecified open wound of other part of head, initial encounter: Secondary | ICD-10-CM | POA: Insufficient documentation

## 2012-05-10 DIAGNOSIS — R6884 Jaw pain: Secondary | ICD-10-CM | POA: Insufficient documentation

## 2012-05-10 DIAGNOSIS — M545 Low back pain, unspecified: Secondary | ICD-10-CM | POA: Insufficient documentation

## 2012-05-10 DIAGNOSIS — S1093XA Contusion of unspecified part of neck, initial encounter: Secondary | ICD-10-CM | POA: Insufficient documentation

## 2012-05-10 DIAGNOSIS — S0003XA Contusion of scalp, initial encounter: Secondary | ICD-10-CM | POA: Insufficient documentation

## 2012-05-10 DIAGNOSIS — IMO0002 Reserved for concepts with insufficient information to code with codable children: Secondary | ICD-10-CM | POA: Insufficient documentation

## 2012-05-10 DIAGNOSIS — R51 Headache: Secondary | ICD-10-CM | POA: Insufficient documentation

## 2012-05-10 DIAGNOSIS — Z21 Asymptomatic human immunodeficiency virus [HIV] infection status: Secondary | ICD-10-CM | POA: Insufficient documentation

## 2012-05-10 HISTORY — DX: Human immunodeficiency virus (HIV) disease: B20

## 2012-05-10 LAB — HEPATITIS B SURFACE ANTIGEN: Hepatitis B Surface Ag: NEGATIVE

## 2012-05-10 LAB — HEPATITIS C ANTIBODY (REFLEX): HCV Ab: NEGATIVE

## 2012-05-10 MED ORDER — IBUPROFEN 800 MG PO TABS: 800.0000 mg | ORAL_TABLET | Freq: Three times a day (TID) | ORAL | Status: AC

## 2012-05-10 NOTE — ED Notes (Signed)
Deputy remains at the bedside. Pt with no distress noted.
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
## 2012-05-10 NOTE — ED Notes (Signed)
Pt taken to CT with Deputy.

## 2012-05-10 NOTE — ED Notes (Signed)
Pt discharged into deputy custody.
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
## 2012-05-12 NOTE — ED Provider Notes (Signed)
Medical screening examination/treatment/procedure(s) were performed by non-physician practitioner and as supervising physician I was immediately available for consultation/collaboration.  Juliet Rude. Rubin Payor, MD 05/12/12 [phone]

## 2012-05-29 ENCOUNTER — Emergency Department (HOSPITAL_COMMUNITY): Payer: Medicare Other

## 2012-05-29 ENCOUNTER — Emergency Department (HOSPITAL_COMMUNITY)
Admission: EM | Admit: 2012-05-29 | Discharge: 2012-05-30 | Disposition: A | Discharge status: Home / Self Care | Payer: Medicare Other | Attending: Emergency Medicine | Admitting: Emergency Medicine

## 2012-05-29 ENCOUNTER — Encounter (HOSPITAL_COMMUNITY): Payer: Self-pay

## 2012-05-29 DIAGNOSIS — Z21 Asymptomatic human immunodeficiency virus [HIV] infection status: Secondary | ICD-10-CM | POA: Insufficient documentation

## 2012-05-29 DIAGNOSIS — R4182 Altered mental status, unspecified: Secondary | ICD-10-CM | POA: Insufficient documentation

## 2012-05-29 DIAGNOSIS — F17200 Nicotine dependence, unspecified, uncomplicated: Secondary | ICD-10-CM | POA: Insufficient documentation

## 2012-05-29 DIAGNOSIS — F411 Generalized anxiety disorder: Secondary | ICD-10-CM | POA: Insufficient documentation

## 2012-05-29 DIAGNOSIS — F1910 Other psychoactive substance abuse, uncomplicated: Secondary | ICD-10-CM | POA: Insufficient documentation

## 2012-05-29 DIAGNOSIS — R443 Hallucinations, unspecified: Secondary | ICD-10-CM | POA: Insufficient documentation

## 2012-05-29 LAB — ACETAMINOPHEN LEVEL: Acetaminophen (Tylenol), Serum: <15 ug/mL (ref 10–30)

## 2012-05-29 LAB — COMPREHENSIVE METABOLIC PANEL
Albumin: 3.7 g/dL (ref 3.5–5.2)
Alkaline Phosphatase: 108 U/L (ref 39–117)
BUN: 14 mg/dL (ref 6–23)
Creatinine, Ser: 1.07 mg/dL (ref 0.50–1.35)
GFR calc Af Amer: >90 mL/min (ref >90)
Glucose, Bld: 83 mg/dL (ref 70–99)
Total Protein: 6.7 g/dL (ref 6.0–8.3)

## 2012-05-29 LAB — RAPID URINE DRUG SCREEN, HOSP PERFORMED
Barbiturates: NOT DETECTED
Benzodiazepines: POSITIVE — AB
Cocaine: POSITIVE — AB

## 2012-05-29 LAB — CBC WITH DIFFERENTIAL/PLATELET
Basophils Relative: 1 % (ref 0–1)
Eosinophils Absolute: 0.1 10*3/uL (ref 0.0–0.7)
Eosinophils Relative: 2 % (ref 0–5)
HCT: 36.5 % — ABNORMAL LOW (ref 39.0–52.0)
Hemoglobin: 13 g/dL (ref 13.0–17.0)
Lymphs Abs: 2.3 10*3/uL (ref 0.7–4.0)
MCH: 34.1 pg — ABNORMAL HIGH (ref 26.0–34.0)
MCHC: 35.6 g/dL (ref 30.0–36.0)
MCV: 95.8 fL (ref 78.0–100.0)
Monocytes Absolute: 0.5 10*3/uL (ref 0.1–1.0)
Monocytes Relative: 8 % (ref 3–12)
RBC: 3.81 MIL/uL — ABNORMAL LOW (ref 4.22–5.81)

## 2012-05-29 LAB — ETHANOL: Alcohol, Ethyl (B): <11 mg/dL (ref 0–11)

## 2012-05-29 LAB — SALICYLATE LEVEL: Salicylate Lvl: <2 mg/dL — ABNORMAL LOW (ref 2.8–20.0)

## 2012-05-29 MED ORDER — LORAZEPAM 2 MG/ML IJ SOLN
1.0000 mg | Freq: Once | INTRAMUSCULAR | Status: AC
  Administered 2012-05-29: 1 mg via INTRAVENOUS
  Filled 2012-05-29: qty 1

## 2012-05-29 NOTE — ED Notes (Signed)
Patient given urinal. Patient aware that we need a urine sample.

## 2012-05-29 NOTE — ED Notes (Signed)
[id number]<AV> Expected date:<BR> Expected time:<BR> Means of arrival:<BR> Comments:<BR> EMS/smoked marijuana-feels like he has bugs crawling on him

## 2012-05-29 NOTE — ED Notes (Signed)
Michael [name] w/ ACT team was in room to talk w/ pt, pt not alert enough to talk at this time

## 2012-05-29 NOTE — ED Notes (Signed)
Tele Psych computer not working, IT called, will monitor.

## 2012-05-29 NOTE — ED Notes (Signed)
Patient transported to CT 
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
## 2012-05-29 NOTE — ED Notes (Signed)
Attempted to place pt in blue scrubs to transfer over to the Psych ED.  Pt was very lethargic.  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  When tried to get him to stand to assess his mobility and explain to him why he was being moved he asked "why? Do you think I am crazy?" He does not remember why he is in the hospital and when conversing with him he falls asleep during conversation.  EDPA and Clydie Braun from [facility] team made aware.

## 2012-05-29 NOTE — ED Notes (Signed)
Will attempt to set up tele psych at this time while pt is awake and alert.

## 2012-05-29 NOTE — ED Notes (Signed)
Pt walking in the hallway holding on the wall.  Pt is limping but is steady for the most part.  Pt is demanding to have 2 sandwiches because he states "I have HIV i need to ear a big meal."  Per Victorino Dike RN, pt was given 2 sandwiches for lunch, pt states that he ate 2 sandwiches before lunch and has not eaten since.  Pt made aware that this nurse will get him a sandwich if there are any available.  Pt instructed to go back to his room to avoid falling d/t his limping.  Pt reports that he normally ambulates with a cane but does not have it with him.  Todd NT brought pt a sandwich, after a few minutes pt came to the desk stating that he was given 2 slices of bread not a sandwich.  Pt made aware that dinner trays will be delivered soon and that he will be given his when it arrives.  Pt asked to go back to his room at this time.  Then Tawanna Cooler NT reports that pt had went to the other nurses station to ask for a sandwich.

## 2012-05-29 NOTE — ED Notes (Signed)
Per EMS, pt from home.  States he smoked a small amount of weed tonight and now he is paranoid and having hallucinations.  Pt went to neighbors home asking for help.  Pt a/o on arrival.  Slightly diaphoretic.  Pt believes he has bugs in his head and in his throat. Vitals 150/90, hr 100, resp 22.  spo2 98% on ra.

## 2012-05-29 NOTE — ED Notes (Signed)
Pt too lethargic to complete Telepysch at this time.

## 2012-05-29 NOTE — BH Assessment (Signed)
Assessment Note   Michael [name] is an 46 y.o. male. Pt arrives via EMS after smoking crack and THC. According to EMS records, pt was paranoid & having hallucinations/delusions believing bugs are in his head and throat. Attempted to assess pt, but unable to get him to awake fully or arouse. Spoke with RN, who stated they had not been able to get pt to wake up fully for the past several hours. Pt will need reassessment upon becoming alert and awake.   Axis I: Substance Abuse and Substance Induced Mood Disorder Axis II: Deferred Axis III:  Past Medical History  Diagnosis Date  . HIV disease    Axis IV: other psychosocial or environmental problems Axis V: PENDING REASSESSMENT  Past Medical History:  Past Medical History  Diagnosis Date  . HIV disease     History reviewed. No pertinent past surgical history.  Family History: History reviewed. No pertinent family history.  Social History:  reports that he has been smoking Cigarettes.  He has been smoking about 10 packs per day. He has never used smokeless tobacco. He reports that he drinks about 2 ounces of alcohol per week. He reports that he uses illicit drugs (Marijuana).  Additional Social History:  Alcohol / Drug Use Pain Medications: N/A Prescriptions: See PTA Listing Over the Counter: N/A History of alcohol / drug use?: Yes Longest period of sobriety (when/how long): Unknown Substance #1 Name of Substance 1: ETOH 1 - Age of First Use: Unknown 1 - Amount (size/oz): Unknown 1 - Frequency: Unknown 1 - Duration: Unknown 1 - Last Use / Amount: 05/28/12 Substance #2 Name of Substance 2: Crack/Cocaine 2 - Age of First Use: Unknown 2 - Amount (size/oz): Unknown 2 - Frequency: Unknown 2 - Duration: Unknown 2 - Last Use / Amount: 05/28/12  CIWA: CIWA-Ar BP: 117/73 mmHg Pulse Rate: 69  COWS:    Allergies:  Allergies  Allergen Reactions  . Hydrocodone-Acetaminophen Rash  . Phenytoin Rash    Home Medications:  (Not  in a hospital admission)  OB/GYN Status:  No LMP for male patient.  General Assessment Data Location of Assessment: [facility] ED Living Arrangements: Spouse/significant other Can pt return to current living arrangement?:  (Unknown) Admission Status: Voluntary Is patient capable of signing voluntary admission?: Yes Transfer from: Acute Hospital Referral Source: Other (EMS/[employer])  Education Status Is patient currently in school?: No  Risk to self Suicidal Ideation:  (Unknown - Unable to assess) Suicidal Intent:  (Unknown - Unable to assess) Is patient at risk for suicide?:  (Unknown - Unable to assess) Suicidal Plan?:  (Unknown - Unable to assess) Access to Means:  (Unknown - Unable to assess) What has been your use of drugs/alcohol within the last 12 months?: Unknown - pt positive for Cocaine, THC & Benzos; also high ETOH level Previous Attempts/Gestures:  (Unknown - Unable to assess) How many times?:  (Unknown - Unable to assess) Other Self Harm Risks:  (Unknown - Unable to assess) Triggers for Past Attempts: Unknown Intentional Self Injurious Behavior:  (Unknown - Unable to assess) Family Suicide History: Unable to assess Recent stressful life event(s): Other (Comment) (Substance Abuse/Use) Persecutory voices/beliefs?: Yes (Paranoid per report) Depression:  (Unknown - Unable to assess) Depression Symptoms:  (Unknown - Unable to assess)  Risk to Others Homicidal Ideation:  (Unknown - Unable to assess) Thoughts of Harm to Others:  (Unknown - Unable to assess) Current Homicidal Intent:  (Unknown - Unable to assess) Current Homicidal Plan:  (Unknown - Unable to assess) Access  to Homicidal Means:  (Unknown - Unable to assess) Identified Victim: N/A History of harm to others?:  (Unknown - Unable to assess) Assessment of Violence:  (Unknown - Unable to assess) Does patient have access to weapons?:  (Unknown - Unable to assess) Criminal Charges Pending?:  (Unknown - Unable to  assess) Does patient have a court date:  (Unknown - Unable to assess)  Psychosis Hallucinations: None noted Delusions: Persecutory;Unspecified (Paranoid; believes has bugs in head & throat)  Mental Status Report Appear/Hygiene: Disheveled Eye Contact: Other (Comment) (None - pt would not arouse/awake) Motor Activity: Unable to assess Speech: Unable to assess Level of Consciousness: Sleeping (Pt unable to arouse/awake) Mood: Other (Comment) (Unknown - Unable to assess) Affect: Unable to Assess Anxiety Level:  (Unknown - Unable to assess) Thought Processes:  (Unknown - Unable to assess) Judgement:  (Unknown - Unable to assess) Orientation: Unable to assess (Unknown - Unable to assess) Obsessive Compulsive Thoughts/Behaviors:  (Unknown - Unable to assess)  Cognitive Functioning Concentration:  (Unknown - Unable to assess) Memory:  (Unknown - Unable to assess) IQ:  (Unknown - Unable to assess) Insight:  (Unknown - Unable to assess) Impulse Control:  (Unknown - Unable to assess) Appetite:  (Unknown - Unable to assess) Sleep:  (Unknown - Unable to assess)        Prior Inpatient Therapy Prior Inpatient Therapy:  (Unknown - Unable to assess)  Prior Outpatient Therapy Prior Outpatient Therapy:  (Unknown - Unable to assess)            Values / Beliefs Cultural Requests During Hospitalization: None Spiritual Requests During Hospitalization: None   Advance Directives (For Healthcare) Advance Directive: Patient does not have advance directive    Additional Information 1:1 In Past 12 Months?:  (Unknown - Unable to assess) CIRT Risk:  (Unknown - Unable to assess) Elopement Risk:  (Unknown - Unable to assess) Does patient have medical clearance?: Yes     Disposition:  Disposition Disposition of Patient: Other dispositions (Pending re-assessment & telepsych) Other disposition(s): Other (Comment) (Pending reassessment & telepsych)  On Site Evaluation by:   Reviewed  with Physician:     Romeo Apple 05/29/2012 10:58 AM

## 2012-05-29 NOTE — ED Notes (Signed)
IT help desk called again regarding tele psych machine not working, was told that they are continuing to work on a solution, will inform EDP.

## 2012-05-30 NOTE — ED Notes (Signed)
Tele psych computer remains down, providers aware.

## 2012-05-30 NOTE — ED Notes (Signed)
Tele psych now working, moved to pt's room for MD to speak with pt, NT also at bedside.

## 2012-05-30 NOTE — ED Provider Notes (Addendum)
Medical screening examination/treatment/procedure(s) were performed by non-physician practitioner and as supervising physician I was immediately available for consultation/collaboration.  Olivia Mackie, MD 05/30/12 [phone]  telepsych evaluated PT and agree that he is stable for d/c home with outpatient PSYCh follow up - etoh/ drug referrals provided.   Sunnie Nielsen, MD 05/30/12 [phone]

## 2012-05-30 NOTE — [facility] Counselor (Signed)
D/c home per telepsych.

## 2012-06-09 ENCOUNTER — Telehealth: Payer: Self-pay | Admitting: *Deleted

## 2012-06-09 NOTE — Telephone Encounter (Signed)
The pharmacy called and advised that the patient needs refills on his Complera. Gave him a verbal for the Complera with no refills advised him the last pharmacy has a note attached to the patient chart that he was not picking up his meds.

## 2012-07-03 ENCOUNTER — Other Ambulatory Visit: Payer: Self-pay | Admitting: Internal Medicine

## 2012-07-08 ENCOUNTER — Encounter (HOSPITAL_COMMUNITY): Payer: Self-pay | Admitting: *Deleted

## 2012-07-08 ENCOUNTER — Emergency Department (HOSPITAL_COMMUNITY): Payer: Medicare Other

## 2012-07-08 ENCOUNTER — Observation Stay (HOSPITAL_COMMUNITY)
Admission: EM | Admit: 2012-07-08 | Discharge: 2012-07-10 | Disposition: A | Discharge status: Home / Self Care | Payer: Medicare Other | Attending: Family Medicine | Admitting: Family Medicine

## 2012-07-08 DIAGNOSIS — A5400 Gonococcal infection of lower genitourinary tract, unspecified: Secondary | ICD-10-CM | POA: Insufficient documentation

## 2012-07-08 DIAGNOSIS — E46 Unspecified protein-calorie malnutrition: Secondary | ICD-10-CM | POA: Insufficient documentation

## 2012-07-08 DIAGNOSIS — R1031 Right lower quadrant pain: Secondary | ICD-10-CM

## 2012-07-08 DIAGNOSIS — N453 Epididymo-orchitis: Secondary | ICD-10-CM

## 2012-07-08 DIAGNOSIS — F17200 Nicotine dependence, unspecified, uncomplicated: Secondary | ICD-10-CM | POA: Insufficient documentation

## 2012-07-08 DIAGNOSIS — B20 Human immunodeficiency virus [HIV] disease: Secondary | ICD-10-CM | POA: Insufficient documentation

## 2012-07-08 DIAGNOSIS — A549 Gonococcal infection, unspecified: Secondary | ICD-10-CM | POA: Diagnosis present

## 2012-07-08 DIAGNOSIS — N491 Inflammatory disorders of spermatic cord, tunica vaginalis and vas deferens: Secondary | ICD-10-CM

## 2012-07-08 DIAGNOSIS — Z23 Encounter for immunization: Secondary | ICD-10-CM | POA: Insufficient documentation

## 2012-07-08 DIAGNOSIS — N451 Epididymitis: Secondary | ICD-10-CM | POA: Diagnosis present

## 2012-07-08 DIAGNOSIS — N50819 Testicular pain, unspecified: Secondary | ICD-10-CM

## 2012-07-08 DIAGNOSIS — N509 Disorder of male genital organs, unspecified: Principal | ICD-10-CM | POA: Insufficient documentation

## 2012-07-08 DIAGNOSIS — D72829 Elevated white blood cell count, unspecified: Secondary | ICD-10-CM | POA: Insufficient documentation

## 2012-07-08 DIAGNOSIS — E43 Unspecified severe protein-calorie malnutrition: Secondary | ICD-10-CM

## 2012-07-08 DIAGNOSIS — A64 Unspecified sexually transmitted disease: Secondary | ICD-10-CM

## 2012-07-08 LAB — URINE MICROSCOPIC-ADD ON

## 2012-07-08 LAB — COMPREHENSIVE METABOLIC PANEL
ALT: 17 U/L (ref 0–53)
AST: 21 U/L (ref 0–37)
Albumin: 4 g/dL (ref 3.5–5.2)
Calcium: 9.5 mg/dL (ref 8.4–10.5)
Creatinine, Ser: 0.96 mg/dL (ref 0.50–1.35)
Sodium: 134 mEq/L — ABNORMAL LOW (ref 135–145)

## 2012-07-08 LAB — LACTIC ACID, PLASMA: Lactic Acid, Venous: 1.4 mmol/L (ref 0.5–2.2)

## 2012-07-08 LAB — URINALYSIS, ROUTINE W REFLEX MICROSCOPIC
Glucose, UA: NEGATIVE mg/dL
Ketones, ur: 40 mg/dL — AB
Nitrite: NEGATIVE
Protein, ur: NEGATIVE mg/dL
pH: 6.5 (ref 5.0–8.0)

## 2012-07-08 LAB — CBC WITH DIFFERENTIAL/PLATELET
Basophils Absolute: 0 10*3/uL (ref 0.0–0.1)
Basophils Relative: 0 % (ref 0–1)
Eosinophils Relative: 0 % (ref 0–5)
HCT: 47.4 % (ref 39.0–52.0)
Lymphocytes Relative: 10 % — ABNORMAL LOW (ref 12–46)
MCHC: 35.7 g/dL (ref 30.0–36.0)
MCV: 96.9 fL (ref 78.0–100.0)
Monocytes Absolute: 1.1 10*3/uL — ABNORMAL HIGH (ref 0.1–1.0)
Neutro Abs: 12.6 10*3/uL — ABNORMAL HIGH (ref 1.7–7.7)
Platelets: 281 10*3/uL (ref 150–400)
RDW: 13.1 % (ref 11.5–15.5)
WBC: 15.3 10*3/uL — ABNORMAL HIGH (ref 4.0–10.5)

## 2012-07-08 MED ORDER — CEFTRIAXONE SODIUM 250 MG IJ SOLR: 250.0000 mg | Freq: Once | INTRAMUSCULAR | Status: DC

## 2012-07-08 MED ORDER — MORPHINE SULFATE 4 MG/ML IJ SOLN
4.0000 mg | Freq: Once | INTRAMUSCULAR | Status: AC
  Administered 2012-07-08: 4 mg via INTRAVENOUS
  Filled 2012-07-08: qty 1

## 2012-07-08 MED ORDER — KETOROLAC TROMETHAMINE 30 MG/ML IJ SOLN
30.0000 mg | Freq: Three times a day (TID) | INTRAMUSCULAR | Status: DC | PRN
  Administered 2012-07-09 (×2): 30 mg via INTRAVENOUS
  Filled 2012-07-08 (×2): qty 1

## 2012-07-08 MED ORDER — INFLUENZA VIRUS VACC SPLIT PF IM SUSP
0.5000 mL | INTRAMUSCULAR | Status: AC
  Administered 2012-07-09: 0.5 mL via INTRAMUSCULAR
  Filled 2012-07-08: qty 0.5

## 2012-07-08 MED ORDER — ONDANSETRON HCL 4 MG/2ML IJ SOLN: 4.0000 mg | Freq: Four times a day (QID) | INTRAMUSCULAR | Status: DC | PRN

## 2012-07-08 MED ORDER — SODIUM CHLORIDE 0.9 % IV SOLN
INTRAVENOUS | Status: DC
  Administered 2012-07-08 – 2012-07-09 (×2): 100 mL/h via INTRAVENOUS

## 2012-07-08 MED ORDER — EMTRICITABINE-TENOFOVIR DF 200-300 MG PO TABS
1.0000 | ORAL_TABLET | Freq: Every day | ORAL | Status: DC
  Administered 2012-07-08 – 2012-07-09 (×2): 1 via ORAL
  Filled 2012-07-08 (×3): qty 1

## 2012-07-08 MED ORDER — IBUPROFEN 800 MG PO TABS: 800.0000 mg | ORAL_TABLET | Freq: Four times a day (QID) | ORAL | Status: DC | PRN

## 2012-07-08 MED ORDER — SODIUM CHLORIDE 0.9 % IV SOLN: INTRAVENOUS | Status: AC

## 2012-07-08 MED ORDER — HEPARIN SODIUM (PORCINE) 5000 UNIT/ML IJ SOLN
5000.0000 [IU] | Freq: Three times a day (TID) | INTRAMUSCULAR | Status: DC
  Administered 2012-07-08 – 2012-07-10 (×6): 5000 [IU] via SUBCUTANEOUS
  Filled 2012-07-08 (×8): qty 1

## 2012-07-08 MED ORDER — DEXTROSE 5 % IV SOLN
1.0000 g | INTRAVENOUS | Status: DC
  Administered 2012-07-08: 1 g via INTRAVENOUS
  Filled 2012-07-08 (×2): qty 10

## 2012-07-08 MED ORDER — ACETAMINOPHEN 325 MG PO TABS: 650.0000 mg | ORAL_TABLET | Freq: Four times a day (QID) | ORAL | Status: DC | PRN

## 2012-07-08 MED ORDER — HYDROMORPHONE HCL PF 1 MG/ML IJ SOLN: 1.0000 mg | INTRAMUSCULAR | Status: AC | PRN

## 2012-07-08 MED ORDER — POLYETHYLENE GLYCOL 3350 17 G PO PACK
17.0000 g | PACK | Freq: Every day | ORAL | Status: DC | PRN
  Filled 2012-07-08: qty 1

## 2012-07-08 MED ORDER — OXYCODONE-ACETAMINOPHEN 5-325 MG PO TABS
1.0000 | ORAL_TABLET | Freq: Once | ORAL | Status: AC
  Administered 2012-07-08: 1 via ORAL
  Filled 2012-07-08: qty 1

## 2012-07-08 MED ORDER — KETOROLAC TROMETHAMINE 30 MG/ML IJ SOLN
30.0000 mg | Freq: Once | INTRAMUSCULAR | Status: AC
  Administered 2012-07-08: 30 mg via INTRAVENOUS
  Filled 2012-07-08: qty 1

## 2012-07-08 MED ORDER — ONDANSETRON HCL 4 MG/2ML IJ SOLN: 4.0000 mg | Freq: Three times a day (TID) | INTRAMUSCULAR | Status: DC | PRN

## 2012-07-08 MED ORDER — DEXTROSE 5 % IV SOLN
1.0000 g | Freq: Once | INTRAVENOUS | Status: AC
  Administered 2012-07-08: 1 g via INTRAVENOUS
  Filled 2012-07-08: qty 10

## 2012-07-08 MED ORDER — RILPIVIRINE HCL 25 MG PO TABS
25.0000 mg | ORAL_TABLET | Freq: Every day | ORAL | Status: DC
  Administered 2012-07-08 – 2012-07-09 (×2): 25 mg via ORAL
  Filled 2012-07-08 (×5): qty 1

## 2012-07-08 MED ORDER — ACETAMINOPHEN 650 MG RE SUPP: 650.0000 mg | Freq: Four times a day (QID) | RECTAL | Status: DC | PRN

## 2012-07-08 MED ORDER — ONDANSETRON HCL 4 MG PO TABS: 4.0000 mg | ORAL_TABLET | Freq: Four times a day (QID) | ORAL | Status: DC | PRN

## 2012-07-08 MED ORDER — AZITHROMYCIN 250 MG PO TABS
1000.0000 mg | ORAL_TABLET | Freq: Once | ORAL | Status: AC
  Administered 2012-07-08: 1000 mg via ORAL
  Filled 2012-07-08: qty 4

## 2012-07-08 MED ORDER — IOHEXOL 300 MG/ML  SOLN
80.0000 mL | Freq: Once | INTRAMUSCULAR | Status: AC | PRN
  Administered 2012-07-08: 80 mL via INTRAVENOUS

## 2012-07-08 MED ORDER — EMTRICITAB-RILPIVIR-TENOFOV DF 200-25-300 MG PO TABS: 1.0000 | ORAL_TABLET | Freq: Every day | ORAL | Status: DC

## 2012-07-08 NOTE — ED Notes (Signed)
Patient transported to Ultrasound 

## 2012-07-08 NOTE — Consult Note (Signed)
Reason for Consult:r/o hernia Referring Physician: Nelva Bush, [place]  Cleophas Michael [name] is an 46 y.o. male.  HPI: we were asked to evaluate this patient for right groin and testicular pain and penile discharge. He said that his symptoms began with some burning about 3 weeks ago but really became severe over last one to 2 days. He has had exquisite testicular tenderness and right lower quadrant abdominal pain as well as some purulent penile discharge. He said that he has been moving his bowels normally and tolerating diet without any nausea or vomiting. He denies any bulge in the area of his groin.  Past Medical History  Diagnosis Date  . HIV disease     History reviewed. No pertinent past surgical history.  No family history on file.  Social History:  reports that he has been smoking Cigarettes.  He has been smoking about 10 packs per day. He has never used smokeless tobacco. He reports that he drinks about 2 ounces of alcohol per week. He reports that he uses illicit drugs (Marijuana).  Allergies:  Allergies  Allergen Reactions  . Doxycycline Hives and Nausea And Vomiting  . Hydrocodone-Acetaminophen Rash  . Phenytoin Rash    Medications:  No current facility-administered medications on file prior to encounter.   Current Outpatient Prescriptions on File Prior to Encounter  Medication Sig Dispense Refill  . COMPLERA 200-25-300 MG TABS TAKE 1 TABLET BY MOUTH WITH A 400-600 CALORIE MEAL *MAKE APPT. WITH DOCTOR OFFICE*  30 tablet  0     Results for orders placed during the hospital encounter of 07/08/12 (from the past 48 hour(s))  URINALYSIS, ROUTINE W REFLEX MICROSCOPIC     Status: Abnormal   Collection Time   07/08/12 11:53 AM      Component Value Range Comment   Color, Urine YELLOW  YELLOW    APPearance CLOUDY (*) CLEAR    Specific Gravity, Urine 1.022  1.005 - 1.030    pH 6.5  5.0 - 8.0    Glucose, UA NEGATIVE  NEGATIVE mg/dL    Hgb urine dipstick TRACE (*) NEGATIVE    Bilirubin  Urine NEGATIVE  NEGATIVE    Ketones, ur 40 (*) NEGATIVE mg/dL    Protein, ur NEGATIVE  NEGATIVE mg/dL    Urobilinogen, UA 1.0  0.0 - 1.0 mg/dL    Nitrite NEGATIVE  NEGATIVE    Leukocytes, UA LARGE (*) NEGATIVE   URINE MICROSCOPIC-ADD ON     Status: Normal   Collection Time   07/08/12 11:53 AM      Component Value Range Comment   WBC, UA TOO NUMEROUS TO COUNT  <3 WBC/hpf    Bacteria, UA RARE  RARE   CBC WITH DIFFERENTIAL     Status: Abnormal   Collection Time   07/08/12  2:30 PM      Component Value Range Comment   WBC 15.3 (*) 4.0 - 10.5 K/uL    RBC 4.89  4.22 - 5.81 MIL/uL    Hemoglobin 16.9  13.0 - 17.0 g/dL    HCT 16.1  09.6 - 04.5 %    MCV 96.9  78.0 - 100.0 fL    MCH 34.6 (*) 26.0 - 34.0 pg    MCHC 35.7  30.0 - 36.0 g/dL    RDW 40.9  81.1 - 91.4 %    Platelets 281  150 - 400 K/uL    Neutrophils Relative 83 (*) 43 - 77 %    Neutro Abs 12.6 (*) 1.7 - 7.7 K/uL  Lymphocytes Relative 10 (*) 12 - 46 %    Lymphs Abs 1.5  0.7 - 4.0 K/uL    Monocytes Relative 7  3 - 12 %    Monocytes Absolute 1.1 (*) 0.1 - 1.0 K/uL    Eosinophils Relative 0  0 - 5 %    Eosinophils Absolute 0.1  0.0 - 0.7 K/uL    Basophils Relative 0  0 - 1 %    Basophils Absolute 0.0  0.0 - 0.1 K/uL   COMPREHENSIVE METABOLIC PANEL     Status: Abnormal   Collection Time   07/08/12  2:30 PM      Component Value Range Comment   Sodium 134 (*) 135 - 145 mEq/L    Potassium 4.3  3.5 - 5.1 mEq/L    Chloride 95 (*) 96 - 112 mEq/L    CO2 27  19 - 32 mEq/L    Glucose, Bld 82  70 - 99 mg/dL    BUN 11  6 - 23 mg/dL    Creatinine, Ser 4.09  0.50 - 1.35 mg/dL    Calcium 9.5  8.4 - 81.1 mg/dL    Total Protein 8.0  6.0 - 8.3 g/dL    Albumin 4.0  3.5 - 5.2 g/dL    AST 21  0 - 37 U/L    ALT 17  0 - 53 U/L    Alkaline Phosphatase 129 (*) 39 - 117 U/L    Total Bilirubin 0.7  0.3 - 1.2 mg/dL    GFR calc non Af Amer >90  >90 mL/min    GFR calc Af Amer >90  >90 mL/min   LIPASE, BLOOD     Status: Normal   Collection  Time   07/08/12  2:30 PM      Component Value Range Comment   Lipase 22  11 - 59 U/L   LACTIC ACID, PLASMA     Status: Normal   Collection Time   07/08/12  3:25 PM      Component Value Range Comment   Lactic Acid, Venous 1.4  0.5 - 2.2 mmol/L     US Scrotum  07/08/2012  *RADIOLOGY REPORT*  Clinical Data:  Right testicular pain and swelling.  SCROTAL ULTRASOUND DOPPLER ULTRASOUND OF THE TESTICLES  Technique: Complete ultrasound examination of the testicles, epididymis, and other scrotal structures was performed.  Color and spectral Doppler ultrasound were also utilized to evaluate blood flow to the testicles.  Comparison:  None  Findings:  Right testis:  .  4.0 x 2.6 x 2.5 cm.  No dominant mass.  Left testis:  3.8 x 1.6 x 2.3 cm.  No dominant mass  Right epididymis:  Normal in size and appearance.  Left epididymis:  .Normal in size and appearance.  Hydrocele:  Small bilateral hydroceles.  Varicocele:  Prominent right sided vessels with Valsalva measuring less than 3 mm.  Pulsed Doppler interrogation of both testes demonstrates low resistance flow bilaterally. Abnormal appearance of the right inguinal canal.  Hernia or other abnormality of the spermatic cord not excluded.  IMPRESSION: No evidence of testicular torsion, orchitis or epididymitis.  Small bilateral hydroceles.  Abnormal appearance of the right inguinal canal.  Hernia or other abnormality of the spermatic cord not excluded.   Original Report Authenticated By: Fuller Canada, M.D.    Ct Abdomen Pelvis W Contrast  07/08/2012  *RADIOLOGY REPORT*  Clinical Data: Right lower quadrant pain extending to the scrotum.  CT ABDOMEN AND PELVIS WITH CONTRAST  Technique:  Multidetector CT imaging of the abdomen and pelvis was performed following the standard protocol during bolus administration of intravenous contrast.  Contrast: 80mL OMNIPAQUE IOHEXOL 300 MG/ML  SOLN  Comparison: Ultrasound same day  Findings: Lung bases are clear.  No pleural or  pericardial fluid.  The liver has a normal appearance without focal lesions or biliary ductal dilatation.  No calcified gallstones.  The spleen is normal. The pancreas is normal.  The adrenal glands are normal.  The kidneys are normal.  No cyst, mass, stone or hydronephrosis.  The aorta and IVC are normal.  No retroperitoneal mass or adenopathy. No free fluid in the pelvis.  The bladder appears unremarkable. The prostate gland is prominent.  The calcification in the central prostate is presumed represent a concretion, though a stone in the prostatic urethra is not completely excluded.  I think there is a right inguinal hernia which contains some fat alternatively, one could argue that this could be due to inflammation of the spermatic cord.  No primary bowel pathology is seen.  The appendix is normal.  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  IMPRESSION: There is increased tissue in the right spermatic cord region.  This could be due to a inguinal hernia containing some mesentery. Alternatively, the spermatic cord could be inflamed.  The prostate is enlarged.  There is a central calcification presumed represent a concretion.  A small stone in a prostatic urethra is not excluded, but not actually favored.  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   Original Report Authenticated By: Thomasenia Sales, M.D.    [place] Art/ven Flow Abd Pelv Doppler  07/08/2012  *RADIOLOGY REPORT*  Clinical Data:  Right testicular pain and swelling.  SCROTAL ULTRASOUND DOPPLER ULTRASOUND OF THE TESTICLES  Technique: Complete ultrasound examination of the testicles, epididymis, and other scrotal structures was performed.  Color and spectral Doppler ultrasound were also utilized to evaluate blood flow to the testicles.   Comparison:  None  Findings:  Right testis:  .  4.0 x 2.6 x 2.5 cm.  No dominant mass.  Left testis:  3.8 x 1.6 x 2.3 cm.  No dominant mass  Right epididymis:  Normal in size and appearance.  Left epididymis:  .Normal in size and appearance.  Hydrocele:  Small bilateral hydroceles.  Varicocele:  Prominent right sided vessels with Valsalva measuring less than 3 mm.  Pulsed Doppler interrogation of both testes demonstrates low resistance flow bilaterally. Abnormal appearance of the right inguinal canal.  Hernia or other abnormality of the spermatic cord not excluded.  IMPRESSION: No evidence of testicular torsion, orchitis or epididymitis.  Small bilateral hydroceles.  Abnormal appearance of the right inguinal canal.  Hernia or other abnormality of the spermatic cord not excluded.   Original Report Authenticated By: Fuller Canada, M.D.    All other review of systems negative or noncontributory except as stated in the HPI   Blood pressure 133/82, pulse 75, temperature 99.3 F (37.4 C), temperature source Oral, resp. rate 16, SpO2 100.00%. General appearance: alert, cooperative and no distress Head: Normocephalic, without obvious abnormality, atraumatic Resp: nonlabored GI: soft, no abdominal tenderness but has right groin tenderness and tenderness along his spermatic cord, ND, and no peritoneal signs Male genitalia: exquisite right testicular tenderness, and cord  tenderness, exam somewhat limited by pain but no evidence of RIH with valsalva, no bulge. Left is normal as well  Assessment/Plan: Testicular pain I do not appreciate any hernias exam on either the left or the right side. I think that the thickening of the spermatic cord found on CT scan and ultrasound is most likely due to CORD inflammation and infection. This could also represent a small fat-containing inguinal hernia but I doubt that that would be causing these kinds of symptoms even if present. There is certainly no obvious bulge or any  evidence of any bowel contents if any hernia would be present.  This is confirmed from his clinical history and physical exam as well as CT scan.  If there is concern for inguinal hernias or if he has persistent groin pain after this acute episode of pain resolves, then he can followup with [place] at [facility] surgery in the office.  Lodema Pilot DAVID 07/08/2012, 5:19 PM

## 2012-07-08 NOTE — ED Notes (Signed)
Pt reports RLQ pain radiating down to scrotum x1 month. Urinary frequency, urgency and burning. Cloudy urine. R hip degeneration.

## 2012-07-08 NOTE — H&P (Signed)
[facility] History and Physical  Michael [name] ZOX:[id number] DOB: 1966-09-07 DOA: 07/08/2012  Referring physician:  PCP: No primary provider on file.  Specialists:   Chief Complaint: Testicular pain  HPI: Michael [name] is a 46 y.o. male  This is a 46 year old male with past medical history of HIV with last CD4 count of 910 that comes in for right groin and testicular pain. He also noticed penile discharge for 2 days prior to admission. He said it began with some burning with urination 3 weeks prior to admission the last several days he's exquisite tenderness in the right testicle and right lower quadrant pain with purulent discharge progressively getting worse to the point where he can even walk up straight due to the pain. He relates no fever chills nausea vomiting or diarrhea.   Review of Systems: The patient denies anorexia, fever, weight loss,, vision loss, decreased hearing, hoarseness, chest pain, syncope, dyspnea on exertion, peripheral edema, balance deficits, hemoptysis, abdominal pain, melena, hematochezia, severe indigestion/heartburn, hematuria, incontinence, genital sores, muscle weakness, suspicious skin lesions, transient blindness, difficulty walking, depression, unusual weight change, abnormal bleeding, enlarged lymph nodes, angioedema, and breast masses.    Past Medical History  Diagnosis Date  . HIV disease    Past Surgical History  Procedure Date  . Cerebral aneurysm repair 1995   Social History:  reports that he has been smoking Cigarettes.  He has a 11 pack-year smoking history. He has never used smokeless tobacco. He reports that he drinks about 2 ounces of alcohol per week. He reports that he uses illicit drugs (Marijuana).  this at home by himself can perform all his ADLs   Allergies  Allergen Reactions  . Doxycycline Hives and Nausea And Vomiting  . Hydrocodone-Acetaminophen Rash  . Phenytoin Rash    Family History  Problem Relation Age of Onset    . Hypertension Mother   . Hepatitis C Father     Prior to Admission medications   Medication Sig Start Date End Date Taking? Authorizing Provider  COMPLERA 200-25-300 MG TABS TAKE 1 TABLET BY MOUTH WITH A 400-600 CALORIE MEAL *MAKE APPT. WITH DOCTOR OFFICE* 07/03/12  Yes Judyann Munson, MD   Physical Exam: Filed Vitals:   07/08/12 1101 07/08/12 1356  BP: 124/80 133/82  Pulse: 70 75  Temp: 98 F (36.7 C) 99.3 F (37.4 C)  TempSrc: Oral Oral  Resp: 16 16  SpO2: 96% 100%     General:   in no acute distress  Eyes:  moist sclera anicteric  ENT:  moist mucous membrane  Neck:  no JVD   Cardiovascular:  regular rate and rhythm   Respiratory:  good air movement clear to auscultation   Abdomen:  positive bowel sounds nontender nondistended and soft  Skin:  no rashes ulcerations   His right testicular cord is exquisitely tender to palpation erythematous and more to touch it is also indurated, there is also some scrotal swelling   Psychiatric:  appropriate   Neurologic:  nonfocal  Labs on Admission:  Basic Metabolic Panel:  Lab 07/08/12 8119  NA 134*  K 4.3  CL 95*  CO2 27  GLUCOSE 82  BUN 11  CREATININE 0.96  CALCIUM 9.5  MG --  PHOS --   Liver Function Tests:  Lab 07/08/12 1430  AST 21  ALT 17  ALKPHOS 129*  BILITOT 0.7  PROT 8.0  ALBUMIN 4.0    Lab 07/08/12 1430  LIPASE 22  AMYLASE --   No results found for  this basename: AMMONIA:5 in the last 168 hours CBC:  Lab 07/08/12 1430  WBC 15.3*  NEUTROABS 12.6*  HGB 16.9  HCT 47.4  MCV 96.9  PLT 281   Cardiac Enzymes: No results found for this basename: CKTOTAL:5,CKMB:5,CKMBINDEX:5,TROPONINI:5 in the last 168 hours  BNP (last 3 results) No results found for this basename: PROBNP:3 in the last 8760 hours CBG: No results found for this basename: GLUCAP:5 in the last 168 hours  Radiological Exams on Admission: US Scrotum  07/08/2012  *RADIOLOGY REPORT*  Clinical Data:  Right testicular  pain and swelling.  SCROTAL ULTRASOUND DOPPLER ULTRASOUND OF THE TESTICLES  Technique: Complete ultrasound examination of the testicles, epididymis, and other scrotal structures was performed.  Color and spectral Doppler ultrasound were also utilized to evaluate blood flow to the testicles.  Comparison:  None  Findings:  Right testis:  .  4.0 x 2.6 x 2.5 cm.  No dominant mass.  Left testis:  3.8 x 1.6 x 2.3 cm.  No dominant mass  Right epididymis:  Normal in size and appearance.  Left epididymis:  .Normal in size and appearance.  Hydrocele:  Small bilateral hydroceles.  Varicocele:  Prominent right sided vessels with Valsalva measuring less than 3 mm.  Pulsed Doppler interrogation of both testes demonstrates low resistance flow bilaterally. Abnormal appearance of the right inguinal canal.  Hernia or other abnormality of the spermatic cord not excluded.  IMPRESSION: No evidence of testicular torsion, orchitis or epididymitis.  Small bilateral hydroceles.  Abnormal appearance of the right inguinal canal.  Hernia or other abnormality of the spermatic cord not excluded.   Original Report Authenticated By: Fuller Canada, M.D.    Ct Abdomen Pelvis W Contrast  07/08/2012  *RADIOLOGY REPORT*  Clinical Data: Right lower quadrant pain extending to the scrotum.  CT ABDOMEN AND PELVIS WITH CONTRAST  Technique:  Multidetector CT imaging of the abdomen and pelvis was performed following the standard protocol during bolus administration of intravenous contrast.  Contrast: 80mL OMNIPAQUE IOHEXOL 300 MG/ML  SOLN  Comparison: Ultrasound same day  Findings: Lung bases are clear.  No pleural or pericardial fluid.  The liver has a normal appearance without focal lesions or biliary ductal dilatation.  No calcified gallstones.  The spleen is normal. The pancreas is normal.  The adrenal glands are normal.  The kidneys are normal.  No cyst, mass, stone or hydronephrosis.  The aorta and IVC are normal.  No retroperitoneal mass or  adenopathy. No free fluid in the pelvis.  The bladder appears unremarkable. The prostate gland is prominent.  The calcification in the central prostate is presumed represent a concretion, though a stone in the prostatic urethra is not completely excluded.  I think there is a right inguinal hernia which contains some fat alternatively, one could argue that this could be due to inflammation of the spermatic cord.  No primary bowel pathology is seen.  The appendix is normal.  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  IMPRESSION: There is increased tissue in the right spermatic cord region.  This could be due to a inguinal hernia containing some mesentery. Alternatively, the spermatic cord could be inflamed.  The prostate is enlarged.  There is a central calcification presumed represent a concretion.  A small stone in a prostatic urethra is not excluded, but not actually favored.  Advanced arthropathy of  the hips, probably secondary to chronic avascular necrosis, more extensive on the right than the left.   Original Report Authenticated By: Thomasenia Sales, M.D.    [place] Art/ven Flow Abd Pelv Doppler  07/08/2012  *RADIOLOGY REPORT*  Clinical Data:  Right testicular pain and swelling.  SCROTAL ULTRASOUND DOPPLER ULTRASOUND OF THE TESTICLES  Technique: Complete ultrasound examination of the testicles, epididymis, and other scrotal structures was performed.  Color and spectral Doppler ultrasound were also utilized to evaluate blood flow to the testicles.  Comparison:  None  Findings:  Right testis:  .  4.0 x 2.6 x 2.5 cm.  No dominant mass.  Left testis:  3.8 x 1.6 x 2.3 cm.  No dominant mass  Right epididymis:  Normal in size and appearance.  Left epididymis:  .Normal in size and appearance.  Hydrocele:  Small bilateral hydroceles.  Varicocele:  Prominent right sided vessels with Valsalva  measuring less than 3 mm.  Pulsed Doppler interrogation of both testes demonstrates low resistance flow bilaterally. Abnormal appearance of the right inguinal canal.  Hernia or other abnormality of the spermatic cord not excluded.  IMPRESSION: No evidence of testicular torsion, orchitis or epididymitis.  Small bilateral hydroceles.  Abnormal appearance of the right inguinal canal.  Hernia or other abnormality of the spermatic cord not excluded.   Original Report Authenticated By: Fuller Canada, M.D.     EKG: Independently reviewed.  non-  Assessment/Plan Principal Problem: Epididymitis, right: Admit him for 23-48 hours observation for pain control continue him on ketorolac, start him on Rocephin 1 g IV every 24 hours we'll send urine cultures. We'll also send discharge for cultures. ED already given one dose of azithromycin should cover him for chlamydia. Send urethral discharge for cultures. -His UDS was positive for benzos marijuana and cocaine. We'll try to avoid narcotics.  HIV DISEASE -Continue home meds.   Leukocytosis Most likely secondary to epididymitis  Code Status: : Full  Disposition Plan:  1-2 days  (indicate anticipated LOS)  Time spent:  60 minutes   Marinda Elk [facility] Pager [phone]  If 7PM-7AM, please contact night-coverage [email or web address] Password [facility] 07/08/2012, 6:27 PM

## 2012-07-08 NOTE — ED Notes (Signed)
ZOX:[id number]<AV> Expected date:07/08/12<BR> Expected time:10:27 AM<BR> Means of arrival:Ambulance<BR> Comments:<BR> abd pain

## 2012-07-08 NOTE — ED Notes (Signed)
Gave patient a sandwich and a piece of cheese with some applesauce

## 2012-07-08 NOTE — ED Notes (Signed)
Gave patient a ice pack per PA.

## 2012-07-08 NOTE — ED Provider Notes (Signed)
Medical screening examination/treatment/procedure(s) were conducted as a shared visit with non-physician practitioner(s) and myself.  I personally evaluated the patient during the encounter  Hx HIV, 3 week history of lower abdominal pain radiating into testicle,worse in past 2 days.  + purulent penile discharge. Exquisite R testicular tenderness, no overlying skin change, no appreciable hernia or bulge with valsalva.  Likely orchitis and spermatic cord inflammation.  Glynn Octave, MD 07/08/12 [phone]

## 2012-07-08 NOTE — ED Provider Notes (Signed)
History     CSN: [id number]  Arrival date & time 07/08/12  1048   First MD Initiated Contact with Patient 07/08/12 1050      Chief Complaint  Patient presents with  . Abdominal Pain    (Consider location/radiation/quality/duration/timing/severity/associated sxs/prior treatment) HPI Comments: 46 year old male with a history of substance abuse and HIV presents emergency department complaining of right lower quadrant abdominal pain onset one month ago.  Progression gradually worsening.  Severity 8/10.  Radiation to right scrotum.  Associated symptoms include urinary frequency, urgency, dysuria, penile discharge and cloudy urine.  Patient denies fevers, night sweats, chills, nausea, vomiting, back pain, weight loss, change in bowel movement or appetite.  Patient reports having unprotected sex.  Last CD4 count unknown currently being treated with Complera.    Patient is a 46 y.o. male presenting with abdominal pain. The history is provided by the patient.  Abdominal Pain The primary symptoms of the illness include abdominal pain and dysuria. The primary symptoms of the illness do not include fever, shortness of breath, nausea, vomiting or diarrhea.  The dysuria is associated with frequency, urgency and penile pain.  Additional symptoms associated with the illness include urgency and frequency. Symptoms associated with the illness do not include chills or constipation.    Past Medical History  Diagnosis Date  . HIV disease     History reviewed. No pertinent past surgical history.  No family history on file.  History  Substance Use Topics  . Smoking status: Current Every Day Smoker -- 10.0 packs/day    Types: Cigarettes  . Smokeless tobacco: Never Used  . Alcohol Use: 2.0 oz/week    4 drink(s) per week      Review of Systems  Constitutional: Negative for fever, chills and appetite change.  HENT: Negative for congestion.   Eyes: Negative for visual disturbance.  Respiratory:  Negative for shortness of breath.   Cardiovascular: Negative for chest pain and leg swelling.  Gastrointestinal: Positive for abdominal pain. Negative for nausea, vomiting, diarrhea, constipation, blood in stool, abdominal distention and anal bleeding.  Genitourinary: Positive for dysuria, urgency, frequency, decreased urine volume, discharge, scrotal swelling, difficulty urinating, penile pain and testicular pain. Negative for genital sores.  Neurological: Negative for dizziness, syncope, weakness, light-headedness, numbness and headaches.  Psychiatric/Behavioral: Negative for confusion.    Allergies  Doxycycline; Hydrocodone-acetaminophen; and Phenytoin  Home Medications   Current Outpatient Rx  Name Route Sig Dispense Refill  . COMPLERA 200-25-300 MG PO TABS  TAKE 1 TABLET BY MOUTH WITH A 400-600 CALORIE MEAL *MAKE APPT. WITH DOCTOR OFFICE* 30 tablet 0    BP 124/80  Pulse 70  Temp 98 F (36.7 C) (Oral)  Resp 16  SpO2 96%  Physical Exam  Nursing note and vitals reviewed. Constitutional: He is oriented to person, place, and time. He appears well-developed and well-nourished. No distress.  HENT:  Head: Normocephalic and atraumatic.  Eyes: Conjunctivae normal and EOM are normal.  Neck: Normal range of motion.  Pulmonary/Chest: Effort normal.  Abdominal: There is tenderness.    Genitourinary: Right testis shows no mass. Left testis shows no mass. No phimosis or paraphimosis.       Exam chaperoned. 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 Milky penile dc. Severe ttp of scrotum w swelling, but no erythema. GC/Chlamydia culture pending. No palpable inguinal hernia, however extreme ttp along right spermatocord region. Palpable lymph nodes bilaterally   Musculoskeletal: Normal range of motion.  Neurological: He is alert and oriented to person, place, and time.  Skin:  Skin is warm and dry. No rash noted. He is not diaphoretic.  Psychiatric: He has a normal mood and affect. His behavior is normal.      ED Course  Procedures (including critical care time)   Labs Reviewed  GC/CHLAMYDIA PROBE AMP, GENITAL  URINALYSIS, ROUTINE W REFLEX MICROSCOPIC   US Scrotum  07/08/2012  *RADIOLOGY REPORT*  Clinical Data:  Right testicular pain and swelling.  SCROTAL ULTRASOUND DOPPLER ULTRASOUND OF THE TESTICLES  Technique: Complete ultrasound examination of the testicles, epididymis, and other scrotal structures was performed.  Color and spectral Doppler ultrasound were also utilized to evaluate blood flow to the testicles.  Comparison:  None  Findings:  Right testis:  .  4.0 x 2.6 x 2.5 cm.  No dominant mass.  Left testis:  3.8 x 1.6 x 2.3 cm.  No dominant mass  Right epididymis:  Normal in size and appearance.  Left epididymis:  .Normal in size and appearance.  Hydrocele:  Small bilateral hydroceles.  Varicocele:  Prominent right sided vessels with Valsalva measuring less than 3 mm.  Pulsed Doppler interrogation of both testes demonstrates low resistance flow bilaterally. Abnormal appearance of the right inguinal canal.  Hernia or other abnormality of the spermatic cord not excluded.  IMPRESSION: No evidence of testicular torsion, orchitis or epididymitis.  Small bilateral hydroceles.  Abnormal appearance of the right inguinal canal.  Hernia or other abnormality of the spermatic cord not excluded.   Original Report Authenticated By: Fuller Canada, M.D.    [place] Art/ven Flow Abd Pelv Doppler  07/08/2012  *RADIOLOGY REPORT*  Clinical Data:  Right testicular pain and swelling.  SCROTAL ULTRASOUND DOPPLER ULTRASOUND OF THE TESTICLES  Technique: Complete ultrasound examination of the testicles, epididymis, and other scrotal structures was performed.  Color and spectral Doppler ultrasound were also utilized to evaluate blood flow to the testicles.  Comparison:  None  Findings:  Right testis:  .  4.0 x 2.6 x 2.5 cm.  No dominant mass.  Left testis:  3.8 x 1.6 x 2.3 cm.  No dominant mass  Right epididymis:  Normal in  size and appearance.  Left epididymis:  .Normal in size and appearance.  Hydrocele:  Small bilateral hydroceles.  Varicocele:  Prominent right sided vessels with Valsalva measuring less than 3 mm.  Pulsed Doppler interrogation of both testes demonstrates low resistance flow bilaterally. Abnormal appearance of the right inguinal canal.  Hernia or other abnormality of the spermatic cord not excluded.  IMPRESSION: No evidence of testicular torsion, orchitis or epididymitis.  Small bilateral hydroceles.  Abnormal appearance of the right inguinal canal.  Hernia or other abnormality of the spermatic cord not excluded.   Original Report Authenticated By: Fuller Canada, M.D.    Followed by Dr. Drue Second, 04/24/2012 CD4 910, VL <20  No diagnosis found.  Consult: General Surgery, To see in ER. Likely fat containing only and will follow up as an OP.   MDM  Clinical orchitis & ?right sided inguanl hernia   46 yo HIV pt presents with right sided inguinal pain, acute onset 2 days ago & penile dc x 1 month. Patient to be admitted for observation & pain management. Discussed importance of using protection when sexually active. Pt understands that they have GC/Chlamydia cultures pending and that they will need to inform all sexual partners if results return positive as well as recommending partners be tested for HIV as well. Pt has been treated prophylacticly with azithromycin and rocephin due to pts history. Imaging  results discussed with attending & consult as above.         Jaci Carrel, [place] 07/08/12 1754

## 2012-07-08 NOTE — ED Notes (Signed)
Pt in CT.

## 2012-07-09 DIAGNOSIS — E43 Unspecified severe protein-calorie malnutrition: Secondary | ICD-10-CM | POA: Diagnosis present

## 2012-07-09 DIAGNOSIS — N509 Disorder of male genital organs, unspecified: Secondary | ICD-10-CM

## 2012-07-09 DIAGNOSIS — E41 Nutritional marasmus: Secondary | ICD-10-CM

## 2012-07-09 LAB — COMPREHENSIVE METABOLIC PANEL
AST: 12 U/L (ref 0–37)
Albumin: 2.7 g/dL — ABNORMAL LOW (ref 3.5–5.2)
Alkaline Phosphatase: 95 U/L (ref 39–117)
BUN: 22 mg/dL (ref 6–23)
Creatinine, Ser: 1.11 mg/dL (ref 0.50–1.35)
Potassium: 4 mEq/L (ref 3.5–5.1)
Total Protein: 5.9 g/dL — ABNORMAL LOW (ref 6.0–8.3)

## 2012-07-09 LAB — CBC
HCT: 40.8 % (ref 39.0–52.0)
MCV: 97.8 fL (ref 78.0–100.0)
RBC: 4.17 MIL/uL — ABNORMAL LOW (ref 4.22–5.81)
WBC: 14.9 10*3/uL — ABNORMAL HIGH (ref 4.0–10.5)

## 2012-07-09 LAB — GC/CHLAMYDIA PROBE AMP, GENITAL: Chlamydia, DNA Probe: NEGATIVE

## 2012-07-09 MED ORDER — LEVOFLOXACIN IN D5W 500 MG/100ML IV SOLN
500.0000 mg | INTRAVENOUS | Status: DC
  Administered 2012-07-09: 500 mg via INTRAVENOUS
  Filled 2012-07-09 (×2): qty 100

## 2012-07-09 MED ORDER — IBUPROFEN 800 MG PO TABS
800.0000 mg | ORAL_TABLET | Freq: Four times a day (QID) | ORAL | Status: DC | PRN
  Administered 2012-07-10 (×2): 800 mg via ORAL
  Filled 2012-07-09 (×2): qty 1

## 2012-07-09 MED ORDER — ENSURE COMPLETE PO LIQD
237.0000 mL | Freq: Four times a day (QID) | ORAL | Status: DC
  Administered 2012-07-09 – 2012-07-10 (×3): 237 mL via ORAL

## 2012-07-09 NOTE — Progress Notes (Signed)
Initial review for observation status is complete. 

## 2012-07-09 NOTE — Progress Notes (Signed)
[facility] PROGRESS NOTE  Michael [name] GNF:[id number] DOB: 12-02-1965 DOA: 07/08/2012 PCP: No primary provider on file. ID--Dr. Drue Second  Assessment/Plan: 1. Right testicular pain/spermatic cord pain--improved. surgery evaluation appreciated. Not thought to be secondary to hernia. Testicular ultrasound unremarkable. 2. Presumed right epididymitis--improved. Not seen on ultrasound, but presumed by history and exam. Treated with Rocephin, add Levaquin total 10 days. Follow-up cultures. 3. HIV--stable by last office visit 04/2012 with ID. Continue Complera substitute. 4. Severe malnutrition in context of chronic illness--Ensure.  Given clinical improvement and decreased pain will continue current management. If fails to improve, consider urology consultation.  Code Status: full code Family Communication: none Disposition Plan: home when improved  Brendia Sacks, MD  [facility] Team 6 Pager [phone]. If 8PM-8AM, please contact night-coverage at [email or web address], password [facility] 07/09/2012, 3:59 PM  LOS: 1 day   Brief narrative: 46 year old man presented with 3 weeks of dysuria, penile discharge, right groin pain and testicular pain.  Consultants:  General surgery  Procedures:  none  Antibiotics:  Rocephin 10/30 >> 10/31  Levaquin 10/31 >> 11/1  HPI/Subjective: Feels better, less pain in right groin, much less pain in right testicle. Testicular swelling resolving.  Objective: Filed Vitals:   07/08/12 1903 07/08/12 2240 07/09/12 0600 07/09/12 1351  BP: 112/61 94/60 112/70 109/76  Pulse: 86 74 84 65  Temp: 98.7 F (37.1 C) 98.3 F (36.8 C) 99.5 F (37.5 C) 97.6 F (36.4 C)  TempSrc: Oral Oral Oral Oral  Resp: 16 21 18 18   Height: 5\' 9"  (1.753 m)     Weight: 63.957 kg (141 lb)     SpO2: 100% 97% 97% 96%    Intake/Output Summary (Last 24 hours) at 07/09/12 1559 Last data filed at 07/09/12 1352  Gross per 24 hour  Intake   3040 ml  Output    500 ml  Net    2540 ml   Filed Weights   07/08/12 1903  Weight: 63.957 kg (141 lb)    Exam:  General:  Appears calm and comfortable Cardiovascular: RRR, no m/r/g. No LE edema. Respiratory: CTA bilaterally, no w/r/r. Normal respiratory effort. GU: normal circumcised penis. Scrotum appears unremarkable. Testicles unremarkable to palpation. Right testicle mildly tender. Right groin moderately tender to palpation. Perineum appears unremarkable. Anal warts noted. Psychiatric: grossly normal mood and affect, speech fluent and appropriate  Data Reviewed: Basic Metabolic Panel:  Lab 07/09/12 5284 07/08/12 1430  NA 132* 134*  K 4.0 4.3  CL 99 95*  CO2 24 27  GLUCOSE 97 82  BUN 22 11  CREATININE 1.11 0.96  CALCIUM 8.7 9.5  MG -- --  PHOS -- --   Liver Function Tests:  Lab 07/09/12 0403 07/08/12 1430  AST 12 21  ALT 12 17  ALKPHOS 95 129*  BILITOT 0.3 0.7  PROT 5.9* 8.0  ALBUMIN 2.7* 4.0    Lab 07/08/12 1430  LIPASE 22  AMYLASE --   CBC:  Lab 07/09/12 0403 07/08/12 1430  WBC 14.9* 15.3*  NEUTROABS -- 12.6*  HGB 14.2 16.9  HCT 40.8 47.4  MCV 97.8 96.9  PLT 223 281   Studies: US Scrotum  07/08/2012  *RADIOLOGY REPORT*  Clinical Data:  Right testicular pain and swelling.  SCROTAL ULTRASOUND DOPPLER ULTRASOUND OF THE TESTICLES  Technique: Complete ultrasound examination of the testicles, epididymis, and other scrotal structures was performed.  Color and spectral Doppler ultrasound were also utilized to evaluate blood flow to the testicles.  Comparison:  None  Findings:  Right testis:  .  4.0 x 2.6 x 2.5 cm.  No dominant mass.  Left testis:  3.8 x 1.6 x 2.3 cm.  No dominant mass  Right epididymis:  Normal in size and appearance.  Left epididymis:  .Normal in size and appearance.  Hydrocele:  Small bilateral hydroceles.  Varicocele:  Prominent right sided vessels with Valsalva measuring less than 3 mm.  Pulsed Doppler interrogation of both testes demonstrates low resistance flow  bilaterally. Abnormal appearance of the right inguinal canal.  Hernia or other abnormality of the spermatic cord not excluded.  IMPRESSION: No evidence of testicular torsion, orchitis or epididymitis.  Small bilateral hydroceles.  Abnormal appearance of the right inguinal canal.  Hernia or other abnormality of the spermatic cord not excluded.   Original Report Authenticated By: Fuller Canada, M.D.    Ct Abdomen Pelvis W Contrast  07/08/2012  *RADIOLOGY REPORT*  Clinical Data: Right lower quadrant pain extending to the scrotum.  CT ABDOMEN AND PELVIS WITH CONTRAST  Technique:  Multidetector CT imaging of the abdomen and pelvis was performed following the standard protocol during bolus administration of intravenous contrast.  Contrast: 80mL OMNIPAQUE IOHEXOL 300 MG/ML  SOLN  Comparison: Ultrasound same day  Findings: Lung bases are clear.  No pleural or pericardial fluid.  The liver has a normal appearance without focal lesions or biliary ductal dilatation.  No calcified gallstones.  The spleen is normal. The pancreas is normal.  The adrenal glands are normal.  The kidneys are normal.  No cyst, mass, stone or hydronephrosis.  The aorta and IVC are normal.  No retroperitoneal mass or adenopathy. No free fluid in the pelvis.  The bladder appears unremarkable. The prostate gland is prominent.  The calcification in the central prostate is presumed represent a concretion, though a stone in the prostatic urethra is not completely excluded.  I think there is a right inguinal hernia which contains some fat alternatively, one could argue that this could be due to inflammation of the spermatic cord.  No primary bowel pathology is seen.  The appendix is normal.  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  IMPRESSION: There is increased tissue in the right spermatic cord region.  This could be  due to a inguinal hernia containing some mesentery. Alternatively, the spermatic cord could be inflamed.  The prostate is enlarged.  There is a central calcification presumed represent a concretion.  A small stone in a prostatic urethra is not excluded, but not actually favored.  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   Original Report Authenticated By: Thomasenia Sales, M.D.    [place] Art/ven Flow Abd Pelv Doppler  07/08/2012  *RADIOLOGY REPORT*  Clinical Data:  Right testicular pain and swelling.  SCROTAL ULTRASOUND DOPPLER ULTRASOUND OF THE TESTICLES  Technique: Complete ultrasound examination of the testicles, epididymis, and other scrotal structures was performed.  Color and spectral Doppler ultrasound were also utilized to evaluate blood flow to the testicles.  Comparison:  None  Findings:  Right testis:  .  4.0 x 2.6 x 2.5 cm.  No dominant mass.  Left testis:  3.8 x 1.6 x 2.3 cm.  No dominant mass  Right epididymis:  Normal in size and appearance.  Left epididymis:  .Normal in size and appearance.  Hydrocele:  Small bilateral hydroceles.  Varicocele:  Prominent right sided vessels with Valsalva measuring less than 3 mm.  Pulsed Doppler interrogation of both testes demonstrates low resistance flow bilaterally. Abnormal appearance of the right inguinal canal.  Hernia or other abnormality of the spermatic cord not excluded.  IMPRESSION: No evidence of testicular torsion, orchitis or epididymitis.  Small bilateral hydroceles.  Abnormal appearance of the right inguinal canal.  Hernia or other abnormality of the spermatic cord not excluded.   Original Report Authenticated By: Fuller Canada, M.D.     Scheduled Meds:   . sodium chloride   Intravenous STAT  . cefTRIAXone (ROCEPHIN)  IV  1 g Intravenous Q24H  . emtricitabine-tenofovir  1 tablet Oral Daily   And  . rilpivirine  25 mg Oral Daily  . feeding supplement  237 mL  Oral QID  . heparin  5,000 Units Subcutaneous Q8H  . influenza  inactive virus vaccine  0.5 mL Intramuscular Tomorrow-1000  . ketorolac  30 mg Intravenous Once  .  morphine injection  4 mg Intravenous Once  . DISCONTD: Emtricitab-Rilpivir-Tenofovir  1 tablet Oral Daily   Continuous Infusions:   . sodium chloride 100 mL/hr (07/09/12 0722)    Principal Problem:  *Epididymitis, right Active Problems:  HIV DISEASE  Leukocytosis     Brendia Sacks, MD  [facility] Team 6 Pager [phone]. If 8PM-8AM, please contact night-coverage at [email or web address], password [facility] 07/09/2012, 3:59 PM  LOS: 1 day   Time spent: 30 minutes

## 2012-07-09 NOTE — Progress Notes (Signed)
INITIAL ADULT NUTRITION ASSESSMENT Date: 07/09/2012   Time: 1:05 PM Reason for Assessment: Nutrition risk   INTERVENTION: Ensure Complete QID. Encouraged continued excellent intake as pt eating 100% of meals. Will monitor.   Pt meets criteria for severe malnutrition of chronic illness AEB <75% estimated intake for the past month with 12.4% weight loss during this time frame per pt report.   ASSESSMENT: Male 46 y.o.  Dx: Epididymitis, right  Food/Nutrition Related Hx: Pt reports eating 1-2 meals/day for the past month. Pt states he used to eat more meals/day and drink 3 Ensure/day, however his appetite went down r/t RLQ pain. Pt reports 20 pound unintended weight loss in the past month.   Hx:  Past Medical History  Diagnosis Date  . HIV disease    Related Meds: Scheduled Meds:   . sodium chloride   Intravenous STAT  . azithromycin  1,000 mg Oral Once  . cefTRIAXone (ROCEPHIN)  IV  1 g Intravenous Once  . cefTRIAXone (ROCEPHIN)  IV  1 g Intravenous Q24H  . emtricitabine-tenofovir  1 tablet Oral Daily   And  . rilpivirine  25 mg Oral Daily  . heparin  5,000 Units Subcutaneous Q8H  . influenza  inactive virus vaccine  0.5 mL Intramuscular Tomorrow-1000  . ketorolac  30 mg Intravenous Once  .  morphine injection  4 mg Intravenous Once  .  morphine injection  4 mg Intravenous Once  . DISCONTD: cefTRIAXone (ROCEPHIN) IM  250 mg Intramuscular Once  . DISCONTD: Emtricitab-Rilpivir-Tenofovir  1 tablet Oral Daily   Continuous Infusions:   . sodium chloride 100 mL/hr (07/09/12 0722)   PRN Meds:.acetaminophen, acetaminophen, HYDROmorphone (DILAUDID) injection, ibuprofen, iohexol, ketorolac, ondansetron (ZOFRAN) IV, ondansetron, polyethylene glycol, DISCONTD: ondansetron (ZOFRAN) IV  Ht: 5\' 9"  (175.3 cm)  Wt: 141 lb (63.957 kg)  Ideal Wt: 160 lb % Ideal Wt: 88  Usual Wt: 161 lb % Usual Wt: 87  Body mass index is 20.82 kg/(m^2).   Labs:  CMP     Component Value Date/Time    NA 132* 07/09/2012 0403   K 4.0 07/09/2012 0403   CL 99 07/09/2012 0403   CO2 24 07/09/2012 0403   GLUCOSE 97 07/09/2012 0403   BUN 22 07/09/2012 0403   CREATININE 1.11 07/09/2012 0403   CREATININE 1.11 04/24/2012 1151   CALCIUM 8.7 07/09/2012 0403   PROT 5.9* 07/09/2012 0403   ALBUMIN 2.7* 07/09/2012 0403   AST 12 07/09/2012 0403   ALT 12 07/09/2012 0403   ALKPHOS 95 07/09/2012 0403   BILITOT 0.3 07/09/2012 0403   GFRNONAA 80* 07/09/2012 0403   GFRAA >90 07/09/2012 0403    Intake/Output Summary (Last 24 hours) at 07/09/12 1328 Last data filed at 07/09/12 0900  Gross per 24 hour  Intake    840 ml  Output    500 ml  Net    340 ml   Last BM -  10/30  Diet Order: General   IVF:    sodium chloride Last Rate: 100 mL/hr (07/09/12 0722)    Estimated Nutritional Needs:   Kcal:2250-2800 Protein:130-160g Fluid:2.2-2.8L  NUTRITION DIAGNOSIS: -Increased nutrient needs (NI-5.1).  Status: Ongoing  RELATED TO: HIV, unintended weight loss  AS EVIDENCE BY: H&P, pt statement  MONITORING/EVALUATION(Goals): Pt to consume 100% of meals/supplements.   EDUCATION NEEDS: -No education needs identified at this time   Dietitian #: [phone]  DOCUMENTATION CODES Per approved criteria  -Severe malnutrition in the context of chronic illness    Marshall Cork 07/09/2012,  1:05 PM

## 2012-07-10 DIAGNOSIS — N491 Inflammatory disorders of spermatic cord, tunica vaginalis and vas deferens: Secondary | ICD-10-CM | POA: Diagnosis present

## 2012-07-10 DIAGNOSIS — N498 Inflammatory disorders of other specified male genital organs: Secondary | ICD-10-CM

## 2012-07-10 DIAGNOSIS — A549 Gonococcal infection, unspecified: Secondary | ICD-10-CM | POA: Diagnosis present

## 2012-07-10 DIAGNOSIS — A5400 Gonococcal infection of lower genitourinary tract, unspecified: Secondary | ICD-10-CM

## 2012-07-10 LAB — BASIC METABOLIC PANEL
BUN: 13 mg/dL (ref 6–23)
CO2: 22 mEq/L (ref 19–32)
Calcium: 8.5 mg/dL (ref 8.4–10.5)
Chloride: 104 mEq/L (ref 96–112)
Creatinine, Ser: 1.02 mg/dL (ref 0.50–1.35)
GFR calc Af Amer: >90 mL/min (ref >90)
GFR calc non Af Amer: 88 mL/min — ABNORMAL LOW (ref >90)
Glucose, Bld: 97 mg/dL (ref 70–99)
Potassium: 4.2 mEq/L (ref 3.5–5.1)
Sodium: 133 mEq/L — ABNORMAL LOW (ref 135–145)

## 2012-07-10 LAB — CBC
HCT: 39.7 % (ref 39.0–52.0)
Hemoglobin: 13.9 g/dL (ref 13.0–17.0)
MCH: 34.4 pg — ABNORMAL HIGH (ref 26.0–34.0)
MCHC: 35 g/dL (ref 30.0–36.0)
MCV: 98.3 fL (ref 78.0–100.0)
Platelets: 227 10*3/uL (ref 150–400)
RBC: 4.04 MIL/uL — ABNORMAL LOW (ref 4.22–5.81)
RDW: 13.4 % (ref 11.5–15.5)
WBC: 13.3 10*3/uL — ABNORMAL HIGH (ref 4.0–10.5)

## 2012-07-10 LAB — URINE CULTURE: Culture: NO GROWTH

## 2012-07-10 MED ORDER — LEVOFLOXACIN 500 MG PO TABS
500.0000 mg | ORAL_TABLET | Freq: Every day | ORAL | Status: DC
  Administered 2012-07-10: 500 mg via ORAL
  Filled 2012-07-10: qty 1

## 2012-07-10 MED ORDER — ENSURE COMPLETE PO LIQD: 237.0000 mL | Freq: Four times a day (QID) | ORAL | Status: DC

## 2012-07-10 MED ORDER — IBUPROFEN 800 MG PO TABS: 800.0000 mg | ORAL_TABLET | Freq: Four times a day (QID) | ORAL | Status: DC | PRN

## 2012-07-10 MED ORDER — LEVOFLOXACIN 500 MG PO TABS: 500.0000 mg | ORAL_TABLET | Freq: Every day | ORAL | Status: DC

## 2012-07-10 MED ORDER — IBUPROFEN 800 MG PO TABS: 800.0000 mg | ORAL_TABLET | Freq: Three times a day (TID) | ORAL | Status: DC | PRN

## 2012-07-10 NOTE — Progress Notes (Addendum)
Pt. Given prescriptions and explained discharge instructions. Asked if he had any questions, pt. Denied He denied needing a wheelchair and ambulated to his ride.

## 2012-07-10 NOTE — Progress Notes (Signed)
[facility] PROGRESS NOTE  Michael [name] WUJ:[id number] DOB: [birth year] DOA: 07/08/2012 PCP: No primary provider on file. ID--Dr. Drue Second  Assessment/Plan: 1. Right testicular pain/spermatic cord pain--testicular pain resolved. Still has exquisite cord tenderness. Initial surgical consulation reviewed, will ask for reassessment given persistant pain. Discussed with Dr. Margarita Grizzle, urology, suggested antibiotic treament. 2. Presumed right epididymitis--clinically resolved. Not seen on ultrasound, but presumed by history and exam. Treated with Rocephin. Discussed with Dr. Snider--doxycycline 10 days total. 3. Gonorrhea--treated with Rocephin & Zithromax. I advised patient to inform partners and advise them to seek treatment. Patient advised to inform partners of HIV status. 4. HIV--stable by last office visit 04/2012 with ID. Continue Complera substitute. 5. Severe malnutrition in context of chronic illness--Ensure.  Code Status: full code Family Communication: none Disposition Plan: home 1-2 days  Brendia Sacks, MD  [facility] Team 6 Pager [phone]. If 8PM-8AM, please contact night-coverage at [email or web address], password [facility] 07/10/2012, 12:22 PM  LOS: 2 days   Brief narrative: 46 year old man presented with 3 weeks of dysuria, penile discharge, right groin pain and testicular pain.  Consultants:  General surgery  Procedures:  none  Antibiotics:  Rocephin 10/30 >> 10/31  Levaquin 10/31 >> 11/1  HPI/Subjective: Still has dysuria, some discharge. Right testicular pain has resolved. Still has right groin pain.  Objective: Filed Vitals:   07/09/12 0600 07/09/12 1351 07/09/12 2143 07/10/12 0609  BP: 112/70 109/76 126/68 106/57  Pulse: 84 65 92 86  Temp: 99.5 F (37.5 C) 97.6 F (36.4 C) 98.3 F (36.8 C) 99.1 F (37.3 C)  TempSrc: Oral Oral Oral Oral  Resp: 18 18 16 16   Height:      Weight:      SpO2: 97% 96% 100% 99%    Intake/Output Summary (Last 24 hours)  at 07/10/12 1222 Last data filed at 07/10/12 1045  Gross per 24 hour  Intake   2880 ml  Output   1200 ml  Net   1680 ml   Filed Weights   07/08/12 1903  Weight: 63.957 kg (141 lb)    Exam:  General:  Appears calm and comfortable, but winces with any movement and localizes to right groin. Cardiovascular: RRR, no m/r/g. No LE edema. Respiratory: CTA bilaterally, no w/r/r. Normal respiratory effort. GU: normal circumcised penis. Scrotum appears unremarkable. Testicles unremarkable to palpation. Right testicle no longer tender. Right groin exquistely tender to palpation.  Psychiatric: grossly normal mood and affect, speech fluent and appropriate  Data Reviewed: Basic Metabolic Panel:  Lab 07/10/12 8657 07/09/12 0403 07/08/12 1430  NA 133* 132* 134*  K 4.2 4.0 4.3  CL 104 99 95*  CO2 22 24 27   GLUCOSE 97 97 82  BUN 13 22 11   CREATININE 1.02 1.11 0.96  CALCIUM 8.5 8.7 9.5  MG -- -- --  PHOS -- -- --   Liver Function Tests:  Lab 07/09/12 0403 07/08/12 1430  AST 12 21  ALT 12 17  ALKPHOS 95 129*  BILITOT 0.3 0.7  PROT 5.9* 8.0  ALBUMIN 2.7* 4.0    Lab 07/08/12 1430  LIPASE 22  AMYLASE --   CBC:  Lab 07/10/12 0421 07/09/12 0403 07/08/12 1430  WBC 13.3* 14.9* 15.3*  NEUTROABS -- -- 12.6*  HGB 13.9 14.2 16.9  HCT 39.7 40.8 47.4  MCV 98.3 97.8 96.9  PLT 227 223 281   Studies: US Scrotum  07/08/2012  *RADIOLOGY REPORT*  Clinical Data:  Right testicular pain and swelling.  SCROTAL ULTRASOUND DOPPLER ULTRASOUND OF THE TESTICLES  Technique: Complete ultrasound examination of the testicles, epididymis, and other scrotal structures was performed.  Color and spectral Doppler ultrasound were also utilized to evaluate blood flow to the testicles.  Comparison:  None  Findings:  Right testis:  .  4.0 x 2.6 x 2.5 cm.  No dominant mass.  Left testis:  3.8 x 1.6 x 2.3 cm.  No dominant mass  Right epididymis:  Normal in size and appearance.  Left epididymis:  .Normal in size and  appearance.  Hydrocele:  Small bilateral hydroceles.  Varicocele:  Prominent right sided vessels with Valsalva measuring less than 3 mm.  Pulsed Doppler interrogation of both testes demonstrates low resistance flow bilaterally. Abnormal appearance of the right inguinal canal.  Hernia or other abnormality of the spermatic cord not excluded.  IMPRESSION: No evidence of testicular torsion, orchitis or epididymitis.  Small bilateral hydroceles.  Abnormal appearance of the right inguinal canal.  Hernia or other abnormality of the spermatic cord not excluded.   Original Report Authenticated By: Fuller Canada, M.D.    Ct Abdomen Pelvis W Contrast  07/08/2012  *RADIOLOGY REPORT*  Clinical Data: Right lower quadrant pain extending to the scrotum.  CT ABDOMEN AND PELVIS WITH CONTRAST  Technique:  Multidetector CT imaging of the abdomen and pelvis was performed following the standard protocol during bolus administration of intravenous contrast.  Contrast: 80mL OMNIPAQUE IOHEXOL 300 MG/ML  SOLN  Comparison: Ultrasound same day  Findings: Lung bases are clear.  No pleural or pericardial fluid.  The liver has a normal appearance without focal lesions or biliary ductal dilatation.  No calcified gallstones.  The spleen is normal. The pancreas is normal.  The adrenal glands are normal.  The kidneys are normal.  No cyst, mass, stone or hydronephrosis.  The aorta and IVC are normal.  No retroperitoneal mass or adenopathy. No free fluid in the pelvis.  The bladder appears unremarkable. The prostate gland is prominent.  The calcification in the central prostate is presumed represent a concretion, though a stone in the prostatic urethra is not completely excluded.  I think there is a right inguinal hernia which contains some fat alternatively, one could argue that this could be due to inflammation of the spermatic cord.  No primary bowel pathology is seen.  The appendix is normal.  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  IMPRESSION: There is increased tissue in the right spermatic cord region.  This could be due to a inguinal hernia containing some mesentery. Alternatively, the spermatic cord could be inflamed.  The prostate is enlarged.  There is a central calcification presumed represent a concretion.  A small stone in a prostatic urethra is not excluded, but not actually favored.  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   Original Report Authenticated By: Thomasenia Sales, M.D.    [place] Art/ven Flow Abd Pelv Doppler  07/08/2012  *RADIOLOGY REPORT*  Clinical Data:  Right testicular pain and swelling.  SCROTAL ULTRASOUND DOPPLER ULTRASOUND OF THE TESTICLES  Technique: Complete ultrasound examination of the testicles, epididymis, and other scrotal structures was performed.  Color and spectral Doppler ultrasound were also utilized to evaluate blood flow to the testicles.  Comparison:  None  Findings:  Right testis:  .  4.0 x 2.6 x 2.5 cm.  No dominant mass.  Left testis:  3.8 x 1.6 x 2.3 cm.  No dominant mass  Right epididymis:  Normal in size and appearance.  Left epididymis:  .Normal in size and appearance.  Hydrocele:  Small bilateral hydroceles.  Varicocele:  Prominent right sided vessels with Valsalva measuring less than 3 mm.  Pulsed Doppler interrogation of both testes demonstrates low resistance flow bilaterally. Abnormal appearance of the right inguinal canal.  Hernia or other abnormality of the spermatic cord not excluded.  IMPRESSION: No evidence of testicular torsion, orchitis or epididymitis.  Small bilateral hydroceles.  Abnormal appearance of the right inguinal canal.  Hernia or other abnormality of the spermatic cord not excluded.   Original Report Authenticated By: Fuller Canada, M.D.     Scheduled  Meds:    . sodium chloride   Intravenous STAT  . emtricitabine-tenofovir  1 tablet Oral Daily   And  . rilpivirine  25 mg Oral Daily  . feeding supplement  237 mL Oral QID  . heparin  5,000 Units Subcutaneous Q8H  . levofloxacin (LEVAQUIN) IV  500 mg Intravenous Q24H  . DISCONTD: cefTRIAXone (ROCEPHIN)  IV  1 g Intravenous Q24H   Continuous Infusions:    . DISCONTD: sodium chloride 100 mL/hr (07/09/12 0722)    Principal Problem:  *Epididymitis, right Active Problems:  HIV DISEASE  Leukocytosis  Severe malnutrition  Gonorrhea  Spermatic cord inflammation     Brendia Sacks, MD  [facility] Team 6 Pager [phone]. If 8PM-8AM, please contact night-coverage at [email or web address], password [facility] 07/10/2012, 12:22 PM  LOS: 2 days   Time spent: 25 minutes

## 2012-07-10 NOTE — Discharge Summary (Addendum)
Physician Discharge Summary  Michael [name] KGM:[id number] DOB: 05-Apr-1966 DOA: 07/08/2012  PCP: No primary provider on file. Has none. ID--Dr. Drue Second  Admit date: 07/08/2012 Discharge date: 07/10/2012  Recommendations for Outpatient Follow-up:  1. Follow-up resolution of gonorrhea 2. Follow-up resolution of right spermatic cord pain  Follow-up Information    Follow up with Judyann Munson, MD. (office will call you for follow-up appointment)    Contact information:   [address] [place] [address] [phone]       Follow up with LAYTON, BRIAN DAVID, DO. (if pain does not completely resolve, make follow-up appointment in 2 weeks)    Contact information:   [address] [place] [address] [phone]         Discharge Diagnoses:  1. Right testicular pain/spermatic cord pain 2. Presumed right epididymitis 3. Gonorrhea 4. HIV 5. Severe malnutrition in context of chronic illness  Discharge Condition: improved Disposition: home  Diet recommendation: regular, supplemental nutrition  Filed Weights   07/08/12 1903  Weight: 63.957 kg (141 lb)    History of present illness:  46 year old man presented with 3 weeks of dysuria, penile discharge, right groin pain and testicular pain.  Hospital Course:  Mr. Heaster was admitted for further evaluation and treatment of the above. He was treated presumptively with Rocephin and Zithromax. Subsequent lab work confirmed gonorrhea. Chlamydia probe was negative. Both ultrasound and CT noted an abnormal right spermatic cord and possible hernia. General surgery evaluated and concluded that no hernia was present and regardless would not account for pain. Because of testicular pain epididymitis was presumed (though there was no sonographic evidence) and because of HIV status, patient will complete a course of Levaquin as an outpatient. While hospitalized right testicular pain completely resolved. Right  spermatic cord pain has somewhat improved, though not resolved. However, mobility has improved and pain is dramatically decreased from 48 hours ago (when patient had difficulty walking). It is anticipated that pain will resolve over the next several days. Patient will follow-up with ID, and if needed, surgery as an outpatient.  1. Right testicular pain/spermatic cord pain--testicular pain resolved. Cord tenderness persists. No evidence of hernia per surgery. Discussed with Dr. Margarita Grizzle, urology, suggested antibiotic treament, no role for surgical intervention. Ultrasound negative for injury, testicular torsion. 2. Presumed right epididymitis--clinically resolved. Not seen on ultrasound, but presumed by history and exam. Treated with Rocephin. Discussed with Dr. Charlynne Pander.  3. Gonorrhea--treated with Rocephin & Zithromax. I advised patient to inform partners and advise them to seek treatment. Patient advised to inform partners of HIV status.  4. HIV--stable by last office visit 04/2012 with ID. Continue Complera substitute.  5. Severe malnutrition in context of chronic illness--Ensure.  Consultants:  General surgery  Procedures:  none  Antibiotics:  Rocephin 10/30 >> 10/31   Zithromax 10/30 >. 10/30  Levaquin 10/31 >> 11/9   Discharge Instructions  Discharge Orders    Future Orders Please Complete By Expires   Diet general      Discharge instructions      Comments:   Be sure to complete antibiotic. Dr. Feliz Beam office will contact you for follow-up. Be sure to notify your partners of your diagnosis of gonorrhea. Call physician or seek immediate medical attention for increased groin or testicle pain, discharge or increased pain with urination.   Activity as tolerated - No restrictions          Medication List     As of 07/10/2012  3:08  PM    TAKE these medications         COMPLERA 200-25-300 MG Tabs   Generic drug: Emtricitab-Rilpivir-Tenofovir   TAKE 1 TABLET BY MOUTH  WITH A 400-600 CALORIE MEAL *MAKE APPT. WITH DOCTOR OFFICE*      feeding supplement Liqd   Take 237 mLs by mouth 4 (four) times daily.      ibuprofen 800 MG tablet   Commonly known as: ADVIL,MOTRIN   Take 1 tablet (800 mg total) by mouth every 8 (eight) hours as needed for pain.      levofloxacin 500 MG tablet   Commonly known as: LEVAQUIN   Take 1 tablet (500 mg total) by mouth daily. Start 11/2 at 1700.         The results of significant diagnostics from this hospitalization (including imaging, microbiology, ancillary and laboratory) are listed below for reference.    Significant Diagnostic Studies: US Scrotum  07/08/2012  *RADIOLOGY REPORT*  Clinical Data:  Right testicular pain and swelling.  SCROTAL ULTRASOUND DOPPLER ULTRASOUND OF THE TESTICLES  Technique: Complete ultrasound examination of the testicles, epididymis, and other scrotal structures was performed.  Color and spectral Doppler ultrasound were also utilized to evaluate blood flow to the testicles.  Comparison:  None  Findings:  Right testis:  .  4.0 x 2.6 x 2.5 cm.  No dominant mass.  Left testis:  3.8 x 1.6 x 2.3 cm.  No dominant mass  Right epididymis:  Normal in size and appearance.  Left epididymis:  .Normal in size and appearance.  Hydrocele:  Small bilateral hydroceles.  Varicocele:  Prominent right sided vessels with Valsalva measuring less than 3 mm.  Pulsed Doppler interrogation of both testes demonstrates low resistance flow bilaterally. Abnormal appearance of the right inguinal canal.  Hernia or other abnormality of the spermatic cord not excluded.  IMPRESSION: No evidence of testicular torsion, orchitis or epididymitis.  Small bilateral hydroceles.  Abnormal appearance of the right inguinal canal.  Hernia or other abnormality of the spermatic cord not excluded.   Original Report Authenticated By: Fuller Canada, M.D.    Ct Abdomen Pelvis W Contrast  07/08/2012  *RADIOLOGY REPORT*  Clinical Data: Right lower  quadrant pain extending to the scrotum.  CT ABDOMEN AND PELVIS WITH CONTRAST  Technique:  Multidetector CT imaging of the abdomen and pelvis was performed following the standard protocol during bolus administration of intravenous contrast.  Contrast: 80mL OMNIPAQUE IOHEXOL 300 MG/ML  SOLN  Comparison: Ultrasound same day  Findings: Lung bases are clear.  No pleural or pericardial fluid.  The liver has a normal appearance without focal lesions or biliary ductal dilatation.  No calcified gallstones.  The spleen is normal. The pancreas is normal.  The adrenal glands are normal.  The kidneys are normal.  No cyst, mass, stone or hydronephrosis.  The aorta and IVC are normal.  No retroperitoneal mass or adenopathy. No free fluid in the pelvis.  The bladder appears unremarkable. The prostate gland is prominent.  The calcification in the central prostate is presumed represent a concretion, though a stone in the prostatic urethra is not completely excluded.  I think there is a right inguinal hernia which contains some fat alternatively, one could argue that this could be due to inflammation of the spermatic cord.  No primary bowel pathology is seen.  The appendix is normal.  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  IMPRESSION: There is increased tissue in the right spermatic cord region.  This could be due to a inguinal hernia containing some mesentery. Alternatively, the spermatic cord could be inflamed.  The prostate is enlarged.  There is a central calcification presumed represent a concretion.  A small stone in a prostatic urethra is not excluded, but not actually favored.  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   Original Report Authenticated By: Thomasenia Sales, M.D.    Microbiology: Recent Results  (from the past 240 hour(s))  URINE CULTURE     Status: Normal   Collection Time   07/08/12 11:53 AM      Component Value Range Status Comment   Specimen Description URINE, CLEAN CATCH   Final    Special Requests NONE   Final    Culture  Setup Time 07/09/2012 01:30   Final    Colony Count NO GROWTH   Final    Culture NO GROWTH   Final    Report Status 07/10/2012 FINAL   Final   WOUND CULTURE     Status: Normal (Preliminary result)   Collection Time   07/09/12  8:19 PM      Component Value Range Status Comment   Specimen Description Penile   Final    Special Requests Normal   Final    Gram Stain     Final    Value: NO WBC SEEN     NO SQUAMOUS EPITHELIAL CELLS SEEN     NO ORGANISMS SEEN   Culture NO GROWTH   Final    Report Status PENDING   Incomplete      Labs: Basic Metabolic Panel:  Lab 07/10/12 7829 07/09/12 0403 07/08/12 1430  NA 133* 132* 134*  K 4.2 4.0 4.3  CL 104 99 95*  CO2 22 24 27   GLUCOSE 97 97 82  BUN 13 22 11   CREATININE 1.02 1.11 0.96  CALCIUM 8.5 8.7 9.5  MG -- -- --  PHOS -- -- --   Liver Function Tests:  Lab 07/09/12 0403 07/08/12 1430  AST 12 21  ALT 12 17  ALKPHOS 95 129*  BILITOT 0.3 0.7  PROT 5.9* 8.0  ALBUMIN 2.7* 4.0    Lab 07/08/12 1430  LIPASE 22  AMYLASE --   CBC:  Lab 07/10/12 0421 07/09/12 0403 07/08/12 1430  WBC 13.3* 14.9* 15.3*  NEUTROABS -- -- 12.6*  HGB 13.9 14.2 16.9  HCT 39.7 40.8 47.4  MCV 98.3 97.8 96.9  PLT 227 223 281    Principal Problem:  *Epididymitis, right Active Problems:  HIV DISEASE  Leukocytosis  Severe malnutrition  Gonorrhea  Spermatic cord inflammation   Time coordinating discharge: 35 minutes  Signed:  Brendia Sacks, MD [facility] 07/10/2012, 2:50 PM

## 2012-07-10 NOTE — Progress Notes (Signed)
Patient feels better but still tender in right groin and scrotum.  Tolerating diet.  Exam improved from my prior assessment.  Abdominal pain resolved. I do not feel any hernia on exam with or without valsalva.  No bulge.  Certainly no intestinal contents in the hernia.  If his pain persists after treatment, then I he can be seen in the clinic when he is less tender and exam will be better.  I would recommend urology evaluation but I am not sure what I can offer to help.

## 2012-07-10 NOTE — Progress Notes (Signed)
Appreciate Dr. Delice Lesch reassessment.  Patient reassessed. Although he still has pain, he feels better, is ambulating better and motion is no longer restricted. He would like to go home.  As previously noted, patient was discussed with Dr. Margarita Grizzle who indicated that there was no role for surgical intervention and that antibiotics would be treatment of choice.  Allergy to doxycyline noted, patient verifies hives.  Discussed with Dr. Charlynne Pander 500mg  daily x7 days.  Brendia Sacks, MD

## 2012-07-12 LAB — WOUND CULTURE: Special Requests: NORMAL

## 2012-08-10 ENCOUNTER — Other Ambulatory Visit: Payer: Self-pay | Admitting: Internal Medicine

## 2012-08-18 ENCOUNTER — Telehealth: Payer: Self-pay | Admitting: *Deleted

## 2012-08-18 ENCOUNTER — Ambulatory Visit: Payer: Self-pay | Admitting: Internal Medicine

## 2012-08-18 NOTE — Telephone Encounter (Signed)
Left generic voicemail reminding patient that he missed this morning's appointment and needs to call us and reschedule as soon as possible.  Will refer to [facility]. Andree Coss, RN

## 2012-08-20 ENCOUNTER — Encounter: Payer: Self-pay | Admitting: *Deleted

## 2012-08-20 ENCOUNTER — Ambulatory Visit (INDEPENDENT_AMBULATORY_CARE_PROVIDER_SITE_OTHER): Payer: Medicare Other | Admitting: Internal Medicine

## 2012-08-20 ENCOUNTER — Encounter: Payer: Self-pay | Admitting: Internal Medicine

## 2012-08-20 VITALS — BP 109/68 | HR 66 | Temp 97.7°F

## 2012-08-20 DIAGNOSIS — B079 Viral wart, unspecified: Secondary | ICD-10-CM

## 2012-08-20 DIAGNOSIS — M87059 Idiopathic aseptic necrosis of unspecified femur: Secondary | ICD-10-CM

## 2012-08-20 DIAGNOSIS — B20 Human immunodeficiency virus [HIV] disease: Secondary | ICD-10-CM

## 2012-08-20 DIAGNOSIS — K029 Dental caries, unspecified: Secondary | ICD-10-CM

## 2012-08-20 DIAGNOSIS — Z21 Asymptomatic human immunodeficiency virus [HIV] infection status: Secondary | ICD-10-CM

## 2012-08-20 LAB — CBC WITH DIFFERENTIAL/PLATELET
Eosinophils Relative: 2 % (ref 0–5)
HCT: 41.2 % (ref 39.0–52.0)
Lymphocytes Relative: 35 % (ref 12–46)
Lymphs Abs: 2.6 10*3/uL (ref 0.7–4.0)
MCV: 96.7 fL (ref 78.0–100.0)
Monocytes Absolute: 0.6 10*3/uL (ref 0.1–1.0)
Platelets: 261 10*3/uL (ref 150–400)
RBC: 4.26 MIL/uL (ref 4.22–5.81)
WBC: 7.5 10*3/uL (ref 4.0–10.5)

## 2012-08-20 LAB — COMPREHENSIVE METABOLIC PANEL
ALT: 12 U/L (ref 0–53)
Albumin: 4 g/dL (ref 3.5–5.2)
CO2: 28 mEq/L (ref 19–32)
Calcium: 9.1 mg/dL (ref 8.4–10.5)
Chloride: 105 mEq/L (ref 96–112)
Creat: 1.05 mg/dL (ref 0.50–1.35)
Total Protein: 6.4 g/dL (ref 6.0–8.3)

## 2012-08-20 MED ORDER — OXYCODONE-ACETAMINOPHEN 10-325 MG PO TABS: 1.0000 | ORAL_TABLET | Freq: Four times a day (QID) | ORAL | Status: DC | PRN

## 2012-08-20 NOTE — Progress Notes (Signed)
HIV CLINIC NOTE  RFV: routine visit Subjective:    Patient ID: Michael [name], male    DOB: 1966/03/06, 46 y.o.   MRN: [id number]  HPI 46yo Male with HIV, CD 4 count of 910/ VL <20, (jan 2013), on complera.he reports taking his medicine daily but ranges in a 4 hr period (8-12 pm). Still subscribes to food security problems. Gets his meals from the shelter. Last seen in August. Also has AVN. Most recently admitted in October for testicular pain in setting of gonorrhea. Treated with azithro and ctx but then finished 10 day course of levo for epidydimitis, for which he only took 3 days. He is coming to clinic with 2 main complaints. 1) right hip pain 2) he has rectal pain due to anal warts.  Hip pain= he uses a cane but lost it and now uses a stick. He has remote evaluation by orthopedics for his AVN of right hip. 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  He is currently using illicit drugs to help with pain management of right hip."when i can get a hold of it"   Current Outpatient Prescriptions on File Prior to Visit  Medication Sig Dispense Refill  . COMPLERA 200-25-300 MG TABS TAKE 1 TABLET BY MOUTH WITH A 400-600 CALORIE MEAL *MAKE APPT. WITH DOCTOR OFFICE*  30 tablet  0  . feeding supplement (ENSURE COMPLETE) LIQD Take 237 mLs by mouth 4 (four) times daily.      Marland Kitchen ibuprofen (ADVIL,MOTRIN) 800 MG tablet Take 1 tablet (800 mg total) by mouth every 8 (eight) hours as needed for pain.  30 tablet  0  . levofloxacin (LEVAQUIN) 500 MG tablet Take 1 tablet (500 mg total) by mouth daily. Start 11/2 at 1700.  7 tablet  0   Active Ambulatory Problems    Diagnosis Date Noted  . HIV DISEASE 12/05/2008  . DENTAL CARIES 01/19/2009  . ACUTE GINGIVITIS PLAQUE INDUCED 12/05/2008  . ASEPTIC NECROSIS, FEMUR HEAD/NECK 12/05/2008  . PERSONAL  HISTORY OF TRAUMATIC BRAIN INJURY 12/05/2008  . Underweight 03/22/2011  . Warts 10/10/2011  . Contact dermatitis 10/21/2011  . Periodontal disease 10/21/2011  . Epididymitis, right 07/08/2012  . Leukocytosis 07/08/2012  . Severe malnutrition 07/09/2012  . Gonorrhea 07/10/2012  . Spermatic cord inflammation 07/10/2012   Resolved Ambulatory Problems    Diagnosis Date Noted  . No Resolved Ambulatory Problems   Past Medical History  Diagnosis Date  . HIV disease      Review of Systems     Objective:   Physical Exam BP 109/68  Pulse 66  Temp 97.7 F (36.5 C) (Oral) Physical Exam  Constitutional: He is oriented to person, place, and time. He appears well-developed and well-nourished. No distress.  HENT:  Mouth/Throat: Oropharynx is clear and moist. No oropharyngeal exudate.  Cardiovascular: Normal rate, regular rhythm and normal heart sounds. Exam reveals no gallop and no friction rub.  No murmur heard.  Pulmonary/Chest: Effort normal and breath sounds normal. No respiratory distress. He has no wheezes.  Abdominal: Soft. Bowel sounds are normal. He exhibits no distension. There is no tenderness.  Lymphadenopathy:  He has no cervical adenopathy.  Neurological: right hip has limited abduction, external rotation. Ext: muscle wasting to anterior muscle group of thigh R>L Skin: Skin is warm and dry. No rash noted. No erythema.  gu = numerous  anal warts at anus and scattered lesions at base of scrotum        Assessment & Plan:  hiv = he is well controlled on HIV meds, however, not checked in the last 10 months. 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 The patient is young and may benefit from assessment to see if he would a be a candidate for hip arthroplasty. 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 Will need surgical evaluation for removal.  Pain= will give rx for #60 tabs, NR. Reminded him to try to minimize illicit drug use.  Health maintenance = dental caries-> will get on list of dental clinic. Already has flu vax  Paranoid schizophrenia = untreated  rtc in 3 months

## 2012-08-20 NOTE — Progress Notes (Unsigned)
Patient referral made for [facility] for Thursday 09/17/12 at 230 pm with Dr Vallery Sa for AVN Right Hip patient notified.  Patient referral made for [facility] for Thursday 08/27/12 at 915 am for rectal warts patient notified.

## 2012-08-21 LAB — HIV-1 RNA QUANT-NO REFLEX-BLD: HIV 1 RNA Quant: <20 copies/mL (ref <20)

## 2012-08-21 LAB — T-HELPER CELL (CD4) - (RCID CLINIC ONLY): CD4 % Helper T Cell: 40 % (ref 33–55)

## 2012-08-26 ENCOUNTER — Other Ambulatory Visit: Payer: Self-pay | Admitting: *Deleted

## 2012-08-26 DIAGNOSIS — B20 Human immunodeficiency virus [HIV] disease: Secondary | ICD-10-CM

## 2012-08-26 MED ORDER — ENSURE PO LIQD: 1.0000 | Freq: Two times a day (BID) | ORAL | Status: DC

## 2012-08-27 ENCOUNTER — Ambulatory Visit (INDEPENDENT_AMBULATORY_CARE_PROVIDER_SITE_OTHER): Payer: Medicare Other | Admitting: General Surgery

## 2012-08-27 ENCOUNTER — Encounter (INDEPENDENT_AMBULATORY_CARE_PROVIDER_SITE_OTHER): Payer: Self-pay | Admitting: General Surgery

## 2012-08-27 VITALS — BP 128/72 | HR 75 | Temp 98.0°F | Resp 18 | Ht 69.0 in | Wt 145.0 lb

## 2012-08-27 DIAGNOSIS — A630 Anogenital (venereal) warts: Secondary | ICD-10-CM

## 2012-08-27 NOTE — Progress Notes (Signed)
Patient ID: Michael [name], male   DOB: Nov 03, 1965, 46 y.o.   MRN: [id number]  Chief Complaint  Patient presents with  . New Evaluation    rect. warts    HPI Michael [name] is a 46 y.o. male. This patient is known to me for prior ER evaluation of penile discharge which turned out to be STD.  He is now sent to me for eval. 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  They cause itching and he has had bleeding for 6-8 months not only with wiping but in the stool.  He is HIV positive and is currently on medications for this.  cd4 count and viral counts have been pretty good.  He is followed by Dr. Drue Second.  He has had some of these warts on the front and they responded to cryotherapy. HPI  Past Medical History  Diagnosis Date  . HIV disease     Past Surgical History  Procedure Date  . Cerebral aneurysm repair 1995    Family History  Problem Relation Age of Onset  . Hypertension Mother   . Hepatitis C Father     Social History History  Substance Use Topics  . Smoking status: Current Every Day Smoker -- 0.5 packs/day for 22 years    Types: Cigarettes  . Smokeless tobacco: Never Used  . Alcohol Use: 2.0 oz/week    4 drink(s) per week     Comment: 07/08/2012 < than a 6 pack per week    Allergies  Allergen Reactions  . Doxycycline Hives and Nausea And Vomiting  . Hydrocodone-Acetaminophen Rash  . Levofloxacin Itching and Rash  . Phenytoin Rash    Current Outpatient Prescriptions  Medication Sig Dispense Refill  . COMPLERA 200-25-300 MG TABS TAKE 1 TABLET BY MOUTH WITH A 400-600 CALORIE MEAL *MAKE APPT. WITH DOCTOR OFFICE*  30 tablet  0  . ENSURE (ENSURE) Take 1 Can by mouth 2 (two) times daily between meals.  237 mL  12  . feeding supplement (ENSURE COMPLETE) LIQD Take 237 mLs by mouth 4 (four) times daily.      Marland Kitchen ibuprofen (ADVIL,MOTRIN) 800 MG tablet Take 1 tablet (800 mg total) by mouth every 8 (eight) hours as needed for pain.  30 tablet  0  .  oxyCODONE-acetaminophen (PERCOCET) 10-325 MG per tablet Take 1 tablet by mouth every 6 (six) hours as needed for pain.  60 tablet  0  . levofloxacin (LEVAQUIN) 500 MG tablet Take 1 tablet (500 mg total) by mouth daily. Start 11/2 at 1700.  7 tablet  0    Review of Systems Review of Systems  Blood pressure 128/72, pulse 75, temperature 98 F (36.7 C), resp. rate 18, height 5\' 9"  (1.753 m), weight 145 lb (65.772 kg).  Physical Exam Physical Exam  Vitals reviewed. Constitutional: He appears well-developed. No distress.  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  No obvious mass.  No sign of bleeding.     Data Reviewed   Assessment    Anal condyloma Rectal bleeding He has several anal condyloma and some internal as well.  I discussed with him the options for surgical excision with cautery or laser therapy.  I also discussed with him the high risk of recurrence.  He is asking about more options and so I offered a second opinion with Dr. Maisie Fus.  He also has circumferential disease and stricture could be a problem as  well.  We will set him up to see Dr. Maisie Fus about the surgical options. I also recommended colonoscopy for evaluation of the rectal bleeding.    Plan    Colonoscopy Colorectal surgery consultation.       Lodema Pilot DAVID 08/27/2012, 9:51 AM

## 2012-09-07 ENCOUNTER — Encounter (INDEPENDENT_AMBULATORY_CARE_PROVIDER_SITE_OTHER): Payer: Self-pay | Admitting: General Surgery

## 2012-09-07 ENCOUNTER — Other Ambulatory Visit: Payer: Self-pay | Admitting: Internal Medicine

## 2012-09-07 ENCOUNTER — Ambulatory Visit (INDEPENDENT_AMBULATORY_CARE_PROVIDER_SITE_OTHER): Payer: Medicare Other | Admitting: General Surgery

## 2012-09-07 VITALS — BP 118/70 | HR 84 | Temp 98.0°F | Resp 18 | Ht 69.0 in | Wt 138.5 lb

## 2012-09-07 DIAGNOSIS — A630 Anogenital (venereal) warts: Secondary | ICD-10-CM

## 2012-09-07 NOTE — Progress Notes (Signed)
Chief Complaint  Patient presents with  . Other    Anal Condyloma    HISTORY: Michael [name] is a 46 y.o. male who presents to the office with anal condyloma.  Other symptoms include bleeding in his stool and when he wipes.   This had been occurring for 6-43mo.  His bowel habits are regular and his bowel movements are soft.   He has never had a colonoscopy.  Past Medical History  Diagnosis Date  . HIV disease       Past Surgical History  Procedure Date  . Cerebral aneurysm repair 1995        Current Outpatient Prescriptions  Medication Sig Dispense Refill  . COMPLERA 200-25-300 MG TABS TAKE 1 TABLET BY MOUTH WITH A 400-600 CALORIE MEAL *MAKE APPT. WITH DOCTOR OFFICE*  30 tablet  0  . ENSURE (ENSURE) Take 1 Can by mouth 2 (two) times daily between meals.  237 mL  12  . feeding supplement (ENSURE COMPLETE) LIQD Take 237 mLs by mouth 4 (four) times daily.      Marland Kitchen ibuprofen (ADVIL,MOTRIN) 800 MG tablet Take 1 tablet (800 mg total) by mouth every 8 (eight) hours as needed for pain.  30 tablet  0  . levofloxacin (LEVAQUIN) 500 MG tablet Take 1 tablet (500 mg total) by mouth daily. Start 11/2 at 1700.  7 tablet  0  . oxyCODONE-acetaminophen (PERCOCET) 10-325 MG per tablet Take 1 tablet by mouth every 6 (six) hours as needed for pain.  60 tablet  0      Allergies  Allergen Reactions  . Doxycycline Hives and Nausea And Vomiting  . Hydrocodone-Acetaminophen Rash  . Levofloxacin Itching and Rash  . Phenytoin Rash      Family History  Problem Relation Age of Onset  . Hypertension Mother   . Hepatitis C Father     History   Social History  . Marital Status: Married    Spouse Name: N/A    Number of Children: N/A  . Years of Education: N/A   Social History Main Topics  . Smoking status: Current Every Day Smoker -- 0.5 packs/day for 22 years    Types: Cigarettes  . Smokeless tobacco: Never Used  . Alcohol Use: 2.0 oz/week    4 drink(s) per week     Comment: 07/08/2012 < than a 6  pack per week  . Drug Use: Yes    Special: Marijuana  . Sexually Active: Yes -- Male partner(s)   Other Topics Concern  . None   Social History Narrative  . None      REVIEW OF SYSTEMS - PERTINENT POSITIVES ONLY: Review of Systems - General ROS: negative for - chills, fever or weight loss Hematological and Lymphatic ROS: negative for - bleeding problems or blood clots Respiratory ROS: no cough, shortness of breath, or wheezing Cardiovascular ROS: no chest pain or dyspnea on exertion Gastrointestinal ROS: no abdominal pain, change in bowel habits, or black or bloody stools  EXAM: Filed Vitals:   09/07/12 1421  BP: 118/70  Pulse: 84  Temp: 98 F (36.7 C)  Resp: 18    General appearance: alert and cooperative Resp: clear to auscultation bilaterally Cardio: regular rate and rhythm GI: normal findings: soft, non-tender  Anal exam Diagnosis: anal condyloma  Assistant: Glaspey After the risks and benefits were explained, verbal consent was obtained for above procedure  Anesthesia: none Findings: internal and external anal condyloma  LABORATORY RESULTS: Available labs are reviewed  Last CD4 in  Dec 13 was 1040   ASSESSMENT AND PLAN: Michael [name] is a 46 y.o. with internal and external anal condyloma.  He is here for a 2nd opinion.  I believe he would be a good candidate for laser ablation.  We would do a high resolution anoscopy at the same time.  Other options include doing nothing and topical imiquimid cream, which he has tried and didn't tolerate.  I would recommend a colonoscopy prior to treatment, given his age and rectal bleeding.  We once again discussed the recurrence rate being high for this disease and the need for additional treatments.     Vanita Panda, MD Colon and Rectal Surgery / General Surgery [facility]      Visit Diagnoses: No diagnosis found.  Primary Care Physician: Judyann Munson, MD

## 2012-09-07 NOTE — Patient Instructions (Signed)
I have recommended for you to get a colonoscopy.  Once this is complete, we will schedule you for a ablation procedure with the laser.  We will perform a high resolution anoscopy at the same time.  Before surgery, I recommend that he get a colonoscopy due to his rectal bleeding.   Anal Warts  What are anal warts? Anal warts (also called "condyloma acuminata") are a condition that affects the area around and inside the anus. They may also affect the skin of the genital area. They first appear as tiny spots or growths, perhaps as small as the head of a pin, and may grow quite large and cover the entire anal area. Usually, they do not cause pain or discomfort to afflicted individuals and patients may be unaware that the warts are present. Some patients will experience symptoms, such as itching, bleeding, mucus discharge and/or a feeling of a lump or mass in the anal area.  What causes anal warts? They are caused by the human papilloma virus (HPV), which is transmitted from person to person by direct contact. HPV is considered a sexually transmitted disease (STD). You do not have to have anal intercourse to develop anal warts. Do anal warts always need to be removed? Yes. If they are not removed, the warts usually grow larger and multiply. Left untreated, the warts may lead to an increased risk of cancer in the affected area. What treatments are available? If warts are very small and are located only on the skin around the anus, they may be treated with a topical medication. They may also be treated by freezing the warts with liquid nitrogen or removed surgically. Surgery typically involves cutting or burning the warts off. While this provides immediate results, it must be performed using either a local anesthetic - such as novocaine - or a general or spinal anesthetic, depending on the number and exact location of warts being treated. It is important that an internal anal examination with an instrument  called an anoscope be done by your treating physician to ensure you do not have any inside the anal canal (internal anal warts). Internal anal warts may not be as suitable for treatment by topical medications, and may need to be treated surgically. Additionally, your physician may wish to examine the entire pelvic region to include the vaginal or penile area to look for other warts that may require treatment. Must I be hospitalized for surgical treatment? Surgical treatment of anal warts is usually performed as outpatient surgery. How much time will I lose from work after surgical treatment? Most people are moderately uncomfortable for a few days after treatment and pain medication may be prescribed. Depending on the extent of the disease, some people return to work the next day, while others may remain out of work for several days to weeks. Will a single treatment cure the problem? When warts are extensive, your surgeon may wish to perform the surgery in stages. Additionally, recurrent warts are common. The virus that causes the warts can live concealed in tissues that appear normal for several months before another wart develops. As new warts develop, they usually can be treated in the physician's office. Sometimes new warts develop so rapidly that office treatment would be quite uncomfortable. In these situations, a second and, occasionally, third outpatient surgical visit may be recommended. How long is treatment usually continued? Follow-up visits are necessary at frequent intervals for several months after all warts appear to be gone, to be certain that no new  warts occur. What can be done to avoid getting these warts again? In some cases, warts may recur repeatedly after successful removal, since the virus that causes the warts often persists in a dormant state in body tissues. Discuss with your physician how often you should be evaluated for recurrent warts. Abstain from sexual contact with  individuals who have anal (or genital) warts. Since many individuals may be unaware that they suffer from this condition, sexual abstinence, condom protection or limiting sexual contact to single partner will reduce your potential exposure to the contagious virus that causes these warts. As a precaution, sexual partners ought to be checked for warts and other sexual transmitted diseases, even if they have no symptoms. What is a colon and rectal surgeon? Colon and rectal surgeons are experts in the surgical and non-surgical treatment of diseases of the colon, rectum and anus. They have completed advanced surgical training in the treatment of these diseases as well as full general surgical training. Board-certified colon and rectal surgeons complete residencies in general surgery and colon and rectal surgery, and pass intensive examinations conducted by the American Board of Surgery and the American Board of Colon and Rectal Surgery. They are well-versed in the treatment of both benign and malignant diseases of the colon, rectum and anus and are able to perform routine screening examinations and surgically treat conditions if indicated to do so. author: Sherlean Foot, MD, FASCRS, on behalf of the ASCRS Public Relations Committee  [id number] American Society of Colon & Rectal Surgeons

## 2012-09-08 ENCOUNTER — Ambulatory Visit (INDEPENDENT_AMBULATORY_CARE_PROVIDER_SITE_OTHER): Payer: Medicare Other | Admitting: General Surgery

## 2012-09-14 ENCOUNTER — Telehealth (INDEPENDENT_AMBULATORY_CARE_PROVIDER_SITE_OTHER): Payer: Self-pay | Admitting: General Surgery

## 2012-09-14 NOTE — Telephone Encounter (Signed)
[name] with [name] called to notify Dr. Biagio Quint that Michael [name] did not show for his appointment today with [facility]

## 2012-09-17 ENCOUNTER — Encounter (INDEPENDENT_AMBULATORY_CARE_PROVIDER_SITE_OTHER): Payer: Self-pay

## 2012-09-17 NOTE — Telephone Encounter (Signed)
Michael [name], Patient no showed for his appointment at [facility]

## 2012-09-22 ENCOUNTER — Telehealth (INDEPENDENT_AMBULATORY_CARE_PROVIDER_SITE_OTHER): Payer: Self-pay

## 2012-09-22 NOTE — Telephone Encounter (Signed)
Mary from [facility] called to let you know pt no showed for 2nd time today.

## 2012-10-06 ENCOUNTER — Other Ambulatory Visit: Payer: Self-pay | Admitting: Internal Medicine

## 2012-10-06 DIAGNOSIS — B20 Human immunodeficiency virus [HIV] disease: Secondary | ICD-10-CM

## 2012-10-08 ENCOUNTER — Telehealth: Payer: Self-pay | Admitting: *Deleted

## 2012-10-08 NOTE — Telephone Encounter (Signed)
Medication is on the discontinued list of medications.  MD please advise if pt is to continue on this medication.  Please include dosing and instructions for use if pt is continuing on the medication.

## 2012-10-09 ENCOUNTER — Other Ambulatory Visit: Payer: Self-pay | Admitting: Licensed Clinical Social Worker

## 2012-10-09 DIAGNOSIS — B20 Human immunodeficiency virus [HIV] disease: Secondary | ICD-10-CM

## 2012-10-09 MED ORDER — EMTRICITAB-RILPIVIR-TENOFOV DF 200-25-300 MG PO TABS: 1.0000 | ORAL_TABLET | Freq: Every day | ORAL | Status: DC

## 2012-10-16 ENCOUNTER — Encounter: Payer: Self-pay | Admitting: Licensed Clinical Social Worker

## 2012-10-19 NOTE — Telephone Encounter (Signed)
Can you let mr. Michael [name] know that we will discuss refill of fosamax at next visit. Let him know that Using it for extended period of time has not been proven to work.

## 2012-10-20 NOTE — Telephone Encounter (Signed)
Left message for pt to discuss Fosamax at his next OV.  [employer] notified, no refills.

## 2012-11-17 ENCOUNTER — Other Ambulatory Visit: Payer: Self-pay

## 2012-11-23 ENCOUNTER — Other Ambulatory Visit: Payer: Self-pay

## 2012-11-23 DIAGNOSIS — B20 Human immunodeficiency virus [HIV] disease: Secondary | ICD-10-CM

## 2012-11-23 MED ORDER — EMTRICITAB-RILPIVIR-TENOFOV DF 200-25-300 MG PO TABS: 1.0000 | ORAL_TABLET | Freq: Every day | ORAL | Status: DC

## 2012-11-23 NOTE — Telephone Encounter (Signed)
Case Manager, [facility], [place]  Calling to request refill of Complera to [facility] in [place].  Script sent .  Laurell Josephs, RN

## 2012-12-01 ENCOUNTER — Ambulatory Visit: Payer: Self-pay | Admitting: Internal Medicine

## 2013-01-19 ENCOUNTER — Ambulatory Visit: Payer: Self-pay | Admitting: Internal Medicine

## 2013-02-05 ENCOUNTER — Ambulatory Visit (INDEPENDENT_AMBULATORY_CARE_PROVIDER_SITE_OTHER): Payer: Medicare Other | Admitting: General Surgery

## 2013-02-10 ENCOUNTER — Encounter (INDEPENDENT_AMBULATORY_CARE_PROVIDER_SITE_OTHER): Payer: Self-pay | Admitting: General Surgery

## 2013-02-24 ENCOUNTER — Ambulatory Visit (INDEPENDENT_AMBULATORY_CARE_PROVIDER_SITE_OTHER): Payer: Medicare Other | Admitting: Internal Medicine

## 2013-02-24 ENCOUNTER — Encounter: Payer: Self-pay | Admitting: Internal Medicine

## 2013-02-24 ENCOUNTER — Ambulatory Visit: Payer: Self-pay | Admitting: Infectious Diseases

## 2013-02-24 VITALS — BP 101/68 | HR 73 | Temp 98.4°F | Ht 69.0 in | Wt 145.0 lb

## 2013-02-24 DIAGNOSIS — F1910 Other psychoactive substance abuse, uncomplicated: Secondary | ICD-10-CM | POA: Insufficient documentation

## 2013-02-24 DIAGNOSIS — B20 Human immunodeficiency virus [HIV] disease: Secondary | ICD-10-CM

## 2013-02-24 MED ORDER — EMTRICITAB-RILPIVIR-TENOFOV DF 200-25-300 MG PO TABS: 1.0000 | ORAL_TABLET | Freq: Every day | ORAL | Status: DC

## 2013-02-24 NOTE — Assessment & Plan Note (Signed)
He has asked to have his medications delivered to a new dress she provided. He will have his medications filled and sent to him and he will was scheduled for lab visit in 3 weeks and a followup visit and 4. He was seen by his case manager and was observed getting a ride home and not driving himself.

## 2013-02-24 NOTE — Assessment & Plan Note (Signed)
He is also having issues with substance abuse. He was offered assistance. He is oriented x3 and does not want any assistance at this time. He was observed getting into the passenger side of the vehicle.

## 2013-02-24 NOTE — Progress Notes (Signed)
  Subjective:    Patient ID: Michael [name], male    DOB: 1966/06/30, 47 y.o.   MRN: [id number]  HPI He comes in here for follow up of his HIV. He is here today and obviously drunk and possibly other substances onboard. He endorses more than a case of beer and staying up all night drinking. He has been off his HIV medicines for more than a month. He states that he lost them. He believes they were lost during some sort of transition of his living situation though tells me he now has a stable living situation. He is unable to complete the interview says he keeps falling asleep. He is arousable. No other history able to be obtained.   Review of Systems  Unable to perform ROS      Objective:   Physical Exam  Constitutional: He appears well-developed and well-nourished.  Unable to complete the rest of the exam as patient has not able to cooperate          Assessment & Plan:

## 2013-03-10 ENCOUNTER — Emergency Department (HOSPITAL_COMMUNITY)
Admission: EM | Admit: 2013-03-10 | Discharge: 2013-03-10 | Disposition: A | Discharge status: Home / Self Care | Payer: Medicare Other | Attending: Emergency Medicine | Admitting: Emergency Medicine

## 2013-03-10 ENCOUNTER — Encounter (HOSPITAL_COMMUNITY): Payer: Self-pay | Admitting: *Deleted

## 2013-03-10 DIAGNOSIS — Y9389 Activity, other specified: Secondary | ICD-10-CM | POA: Insufficient documentation

## 2013-03-10 DIAGNOSIS — S01501A Unspecified open wound of lip, initial encounter: Secondary | ICD-10-CM | POA: Insufficient documentation

## 2013-03-10 DIAGNOSIS — W268XXA Contact with other sharp object(s), not elsewhere classified, initial encounter: Secondary | ICD-10-CM | POA: Insufficient documentation

## 2013-03-10 DIAGNOSIS — S01511A Laceration without foreign body of lip, initial encounter: Secondary | ICD-10-CM

## 2013-03-10 DIAGNOSIS — F17200 Nicotine dependence, unspecified, uncomplicated: Secondary | ICD-10-CM | POA: Insufficient documentation

## 2013-03-10 DIAGNOSIS — Y929 Unspecified place or not applicable: Secondary | ICD-10-CM | POA: Insufficient documentation

## 2013-03-10 DIAGNOSIS — R599 Enlarged lymph nodes, unspecified: Secondary | ICD-10-CM | POA: Insufficient documentation

## 2013-03-10 DIAGNOSIS — Z21 Asymptomatic human immunodeficiency virus [HIV] infection status: Secondary | ICD-10-CM | POA: Insufficient documentation

## 2013-03-10 DIAGNOSIS — Z23 Encounter for immunization: Secondary | ICD-10-CM | POA: Insufficient documentation

## 2013-03-10 MED ORDER — IBUPROFEN 800 MG PO TABS: 800.0000 mg | ORAL_TABLET | Freq: Three times a day (TID) | ORAL | Status: DC | PRN

## 2013-03-10 MED ORDER — HYDROMORPHONE HCL PF 1 MG/ML IJ SOLN
1.0000 mg | Freq: Once | INTRAMUSCULAR | Status: AC
  Administered 2013-03-10: 1 mg via INTRAMUSCULAR
  Filled 2013-03-10: qty 1

## 2013-03-10 MED ORDER — TETANUS-DIPHTH-ACELL PERTUSSIS 5-2.5-18.5 LF-MCG/0.5 IM SUSP
0.5000 mL | Freq: Once | INTRAMUSCULAR | Status: AC
  Administered 2013-03-10: 0.5 mL via INTRAMUSCULAR
  Filled 2013-03-10: qty 0.5

## 2013-03-10 MED ORDER — AMOXICILLIN-POT CLAVULANATE 875-125 MG PO TABS: 1.0000 | ORAL_TABLET | Freq: Two times a day (BID) | ORAL | Status: DC

## 2013-03-10 NOTE — ED Notes (Signed)
PA at bedside.

## 2013-03-10 NOTE — ED Notes (Signed)
Pt reports removing vinyl siding and a piece cut him on left lower leg. Unknown tetanus.

## 2013-03-10 NOTE — ED Notes (Signed)
No laceration noted on patient's left leg. Laceration noted on left, lower lip.

## 2013-03-10 NOTE — ED Provider Notes (Signed)
This chart was scribed for Antony Madura PA-C, a non-physician practitioner working with No att. providers found by Lewanda Rife, ED Scribe. This patient was seen in room TR07C/TR07C and the patient's care was started at 1545.    History    CSN: [id number] Arrival date & time 03/10/13  1516  First MD Initiated Contact with Patient 03/10/13 1530     Chief Complaint  Patient presents with  . Lip Laceration   (Consider location/radiation/quality/duration/timing/severity/associated sxs/prior Treatment) The history is provided by the patient.   HPI Comments: Michael [name] is a 47 y.o. male with hx of HIV who presents to the Emergency Department complaining of lip laceration of left upper and lower lip, about 5 cm, onset 12 pm today. Reports possibly barbed wire cut him. Reports unable to control bleeding at home. Describes pain as gradually worsening and throbbing, with no radiation. Denies associated LOC, shortness of breath, nausea, emesis, visual disturbances, numbness, tinnitus, and difficulty swallowing. Denies alleviating and aggravating factors. Reports drinking alcohol today "probably just a 40." Reports unknown tetanus status. Denies illicit drug use today.    Past Medical History  Diagnosis Date  . HIV disease    Past Surgical History  Procedure Laterality Date  . Cerebral aneurysm repair  1995   Family History  Problem Relation Age of Onset  . Hypertension Mother   . Hepatitis C Father    History  Substance Use Topics  . Smoking status: Current Every Day Smoker -- 0.50 packs/day for 22 years    Types: Cigarettes  . Smokeless tobacco: Never Used  . Alcohol Use: 126.0 oz/week    252 drink(s) per week     Comment: 02/24/13: drinking 24-36 beers a day; wine, liquor when he can find it    Review of Systems  Constitutional: Negative for fever.  HENT: Negative for trouble swallowing and tinnitus.   Eyes: Negative for visual disturbance.  Respiratory: Negative for  shortness of breath.   Gastrointestinal: Negative for nausea and vomiting.  Musculoskeletal: Negative.   Skin: Positive for wound.  Neurological: Negative for numbness.  All other systems reviewed and are negative.  A complete 10 system review of systems was obtained and all systems are negative except as noted in the HPI and PMH.     Allergies  Doxycycline; Hydrocodone-acetaminophen; Levofloxacin; and Phenytoin  Home Medications   Current Outpatient Rx  Name  Route  Sig  Dispense  Refill  . Emtricitab-Rilpivir-Tenofovir (COMPLERA) 200-25-300 MG TABS   Oral   Take 1 tablet by mouth daily. TAKE 1 TABLET BY MOUTH WITH A 400-600 CALORIE MEAL   30 tablet   5   . oxyCODONE-acetaminophen (PERCOCET) 10-325 MG per tablet   Oral   Take 1 tablet by mouth every 6 (six) hours as needed for pain.   60 tablet   0   . amoxicillin-clavulanate (AUGMENTIN) 875-125 MG per tablet   Oral   Take 1 tablet by mouth every 12 (twelve) hours.   14 tablet   0   . ibuprofen (ADVIL,MOTRIN) 800 MG tablet   Oral   Take 1 tablet (800 mg total) by mouth every 8 (eight) hours as needed for pain.   30 tablet   0    BP 124/84  Pulse 98  Temp(Src) 98.2 F (36.8 C) (Axillary)  Resp 16  SpO2 96% Physical Exam  Nursing note and vitals reviewed. Constitutional: He is oriented to person, place, and time. He appears well-developed and well-nourished. No distress.  HENT:  Head: Normocephalic and atraumatic. No trismus in the jaw.    Right Ear: External ear normal.  Left Ear: External ear normal.  Mouth/Throat: Uvula is midline, oropharynx is clear and moist and mucous membranes are normal. No oral lesions. No edematous. No oropharyngeal exudate, posterior oropharyngeal edema or posterior oropharyngeal erythema.    No fractured dentition    Eyes: Conjunctivae and EOM are normal. No scleral icterus.  Neck: Neck supple. No tracheal deviation present.  Cardiovascular: Normal rate, regular rhythm and  intact distal pulses.   Pulmonary/Chest: Effort normal. No respiratory distress.  Musculoskeletal: Normal range of motion.  Lymphadenopathy:    He has cervical adenopathy.       Left cervical: Superficial cervical adenopathy present.  3 cm lymphadenopathy noted on superficial left cervical region   Neurological: He is alert and oriented to person, place, and time.  Skin: Skin is warm and dry. Laceration noted. No rash noted. No erythema. No pallor.  5 cm elliptical laceration to lateral upper and lower left lip   Psychiatric: He has a normal mood and affect. His behavior is normal.    ED Course  Procedures (including critical care time) Medications  TDaP (BOOSTRIX) injection 0.5 mL (0.5 mLs Intramuscular Given 03/10/13 1616)  HYDROmorphone (DILAUDID) injection 1 mg (1 mg Intramuscular Given 03/10/13 1747)   5:53 PM Page for consult for maxillofacial specialist    Labs Reviewed - No data to display No results found.   1. Lip laceration, initial encounter     MDM  Complicated lip laceration of unknown mechanism - Physical exam as above. Pain controlled with Dilaudid IM and tetanus given in ED. Consult placed to Dr. Jiayi Lengacher Splinter of Maxillofacial who came to evaluate patient. Dr. Nicholaos Schippers Splinter performed laceration repair of lip laceration, which was approximately 5 cm, jagged, and crossing [place] border. Patient tolerated laceration repair well. Topical bacitracin applied. Patient appropriate for d/c with Augmentin to prevent infection; also given instruction to follow up with Dr. Ladonna Vanorder Splinter in office in 2 weeks. Additionally, patient told to follow up with PCP and/or ID doctor regarding marked lymphadenopathy in L cervical region. Indications for ED return discussed with patient who verbalized comfort and understanding with plan.  I personally performed the services described in this documentation, which was scribed in my presence. The recorded information has been reviewed and is accurate.     Antony Madura, PA-C 03/11/13 0145

## 2013-03-12 NOTE — ED Provider Notes (Signed)
Medical screening examination/treatment/procedure(s) were conducted as a shared visit with non-physician practitioner(s) and myself.  I personally evaluated the patient during the encounter  Michael [name] is a 47 y.o. male hx of HIV here with L lip laceration. It happened last night and he couldn't tell me how he got it. Tetanus updated in ED. Laceration involved L corner of mouth and extends beyond the vermillion border. Not a through and through laceration. The PA called [facility], Dr. Kelly Splinter, who sutured up laceration. D/c home on augmentin.    Richardean Canal, MD 03/12/13 [phone]

## 2013-03-17 ENCOUNTER — Other Ambulatory Visit: Payer: Self-pay

## 2013-03-24 ENCOUNTER — Ambulatory Visit: Payer: Self-pay | Admitting: Internal Medicine

## 2013-03-24 ENCOUNTER — Telehealth: Payer: Self-pay | Admitting: *Deleted

## 2013-03-24 NOTE — Telephone Encounter (Signed)
FYI: this patient should be seen as a walk-in. Thanks Asher Muir

## 2013-03-24 NOTE — Telephone Encounter (Signed)
Attempted to call patient regarding his missed lab and follow up appointments.  Neither number listed is in service.  Patient has history of no-shows.  Will need to be seen as a walk-in. Andree Coss, RN

## 2013-05-06 ENCOUNTER — Telehealth: Payer: Self-pay | Admitting: *Deleted

## 2013-05-06 NOTE — Telephone Encounter (Signed)
Requested pt call for MD appt.

## 2013-08-26 ENCOUNTER — Encounter: Payer: Self-pay | Admitting: Internal Medicine

## 2013-08-26 ENCOUNTER — Ambulatory Visit (INDEPENDENT_AMBULATORY_CARE_PROVIDER_SITE_OTHER): Payer: Medicare Other | Admitting: Internal Medicine

## 2013-08-26 ENCOUNTER — Other Ambulatory Visit: Payer: Self-pay | Admitting: *Deleted

## 2013-08-26 VITALS — BP 119/87 | HR 73 | Temp 97.9°F | Ht 69.0 in | Wt 168.4 lb

## 2013-08-26 DIAGNOSIS — M87051 Idiopathic aseptic necrosis of right femur: Secondary | ICD-10-CM

## 2013-08-26 DIAGNOSIS — A630 Anogenital (venereal) warts: Secondary | ICD-10-CM

## 2013-08-26 DIAGNOSIS — M87059 Idiopathic aseptic necrosis of unspecified femur: Secondary | ICD-10-CM

## 2013-08-26 DIAGNOSIS — F1910 Other psychoactive substance abuse, uncomplicated: Secondary | ICD-10-CM

## 2013-08-26 DIAGNOSIS — Z23 Encounter for immunization: Secondary | ICD-10-CM

## 2013-08-26 DIAGNOSIS — B20 Human immunodeficiency virus [HIV] disease: Secondary | ICD-10-CM

## 2013-08-26 LAB — CBC
Hemoglobin: 14.6 g/dL (ref 13.0–17.0)
MCH: 32.6 pg (ref 26.0–34.0)
MCHC: 34 g/dL (ref 30.0–36.0)
MCV: 96 fL (ref 78.0–100.0)
Platelets: 252 10*3/uL (ref 150–400)
RBC: 4.48 MIL/uL (ref 4.22–5.81)

## 2013-08-26 MED ORDER — OXYCODONE HCL 10 MG PO TABS: 10.0000 mg | ORAL_TABLET | Freq: Four times a day (QID) | ORAL | Status: DC

## 2013-08-26 NOTE — Progress Notes (Signed)
Patient ID: Michael [name], male   DOB: 1966-03-18, 47 y.o.   MRN: [id number]          [facility] for Infectious Disease  Patient Active Problem List   Diagnosis Date Noted  . Substance abuse 02/24/2013  . Gonorrhea 07/10/2012  . 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 Severe malnutrition 07/09/2012  . Epididymitis, right 07/08/2012  . Leukocytosis 07/08/2012  . Contact dermatitis 10/21/2011  . Periodontal disease 10/21/2011  . Warts 10/10/2011  . Underweight 03/22/2011  . DENTAL CARIES 01/19/2009  . HIV DISEASE 12/05/2008  . ACUTE GINGIVITIS PLAQUE INDUCED 12/05/2008  . ASEPTIC NECROSIS, FEMUR HEAD/NECK 12/05/2008  . PERSONAL HISTORY OF TRAUMATIC BRAIN INJURY 12/05/2008    Patient's Medications  New Prescriptions   No medications on file  Previous Medications   EMTRICITAB-RILPIVIR-TENOFOVIR (COMPLERA) 200-25-300 MG TABS    Take 1 tablet by mouth daily. TAKE 1 TABLET BY MOUTH WITH A 400-600 CALORIE MEAL  Modified Medications   No medications on file  Discontinued Medications   AMOXICILLIN-CLAVULANATE (AUGMENTIN) 875-125 MG PER TABLET    Take 1 tablet by mouth every 12 (twelve) hours.   IBUPROFEN (ADVIL,MOTRIN) 800 MG TABLET    Take 1 tablet (800 mg total) by mouth every 8 (eight) hours as needed for pain.   OXYCODONE-ACETAMINOPHEN (PERCOCET) 10-325 MG PER TABLET    Take 1 tablet by mouth every 6 (six) hours as needed for pain.    Subjective: Michael [name] is seen on a walk-in basis. He states that he restarted Complera shortly after his visit here in June. He is currently living in a shelter but has been able to arrange having it delivered to him that the shelter. He was incarcerated for a day he use at the end of November under charges of processing is stolen vehicle. He states that he was just getting a ride with a friend who had the car. Those charges are still pending.  He was not given his Complera while incarcerated. He thinks he's only missed 2 other doses in the last  few months when he was out of his medication. He is currently not on any other medications.  He had been taking pain medicine for his hip pain but ran out several months ago. He had seen an orthopedic surgeon but he is not sure of the surgeon's name. He was told that hip replacement surgery was needed but he has not returned because he says that he has been afraid of having surgery. He says he's afraid that he may have some type of anal cancer. 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 High-resolution anoscopy and laser surgery was recommended but he has also been afraid to go back there.  He states that he is now willing to return to see the surgeons. He had a son 13 months ago and that has given him no motivation to stay healthy. He says that he has not used any cocaine and 13 months. He says that he is cut down on his alcohol and last had any alcohol in his birthday on [birth year].  He is cut down 2-3 cigarettes daily in hopes he can quit completely soon. He is currently not in a relationship and has not been sexually active in the past 6 months.  Review of Systems: Constitutional: negative Eyes: negative Ears, nose, mouth, throat, and face: negative Respiratory: negative Cardiovascular: negative Gastrointestinal: negative Genitourinary:negative Musculoskeletal:positive for bilateral hip pain, right greater than left, negative for  back pain and muscle weakness  Past Medical History  Diagnosis Date  . HIV disease     History  Substance Use Topics  . Smoking status: Current Every Day Smoker -- 0.10 packs/day for 22 years    Types: Cigarettes  . Smokeless tobacco: Never Used  . Alcohol Use: 126.0 oz/week    252 drink(s) per week     Comment: 02/24/13: drinking 24-36 beers a day; wine, liquor when he can find it,  last time he drank was 07[birth year]13 his birthday    Family History  Problem Relation Age of Onset  . Hypertension Mother   . Hepatitis C  Father     Allergies  Allergen Reactions  . Doxycycline Hives and Nausea And Vomiting  . Hydrocodone-Acetaminophen Rash  . Levofloxacin Itching and Rash  . Phenytoin Rash    Objective: Temp: 97.9 F (36.6 C) (12/18 1124) Temp src: Oral (12/18 1124) BP: 119/87 mmHg (12/18 1124) Pulse Rate: 73 (12/18 1124)  General: He is calm and in no distress Oral: No oropharyngeal lesions Skin: No rash Lungs: Clear Cor: Regular S1 and S2 no murmurs Abdomen: Soft and nontender Joints and extremities: No pain redness or swelling with palpation over his hips Neuro: Alert and oriented with normal speech and conversation Mood and affect: Normal and appropriate  Lab Results Lab Results  Component Value Date   WBC 7.5 08/20/2012   HGB 14.4 08/20/2012   HCT 41.2 08/20/2012   MCV 96.7 08/20/2012   PLT 261 08/20/2012    Lab Results  Component Value Date   CREATININE 1.05 08/20/2012   BUN 19 08/20/2012   NA 138 08/20/2012   K 4.0 08/20/2012   CL 105 08/20/2012   CO2 28 08/20/2012    Lab Results  Component Value Date   ALT 12 08/20/2012   AST 6 08/20/2012   ALKPHOS 127* 08/20/2012   BILITOT 0.2* 08/20/2012    Lab Results  Component Value Date   CHOL 141 03/22/2011   HDL 42 03/22/2011   LDLCALC 71 03/22/2011   TRIG 141 03/22/2011   CHOLHDL 3.4 03/22/2011    Lab Results HIV 1 RNA Quant (copies/mL)  Date Value  08/20/2012 <20   04/24/2012 <20   09/25/2011 <20      CD4 T Cell Abs (cmm)  Date Value  08/20/2012 1030   09/25/2011 910   02/21/2011 700      Assessment: This sounds like his appearance has improved recently and he is also doing better with regard to drug and alcohol use. I will continue Complera and check lab work today.  I have agreed to refill his oxycodone if he will agree to enter counseling with our substance abuse counselor.  He needs to followup with orthopedics and general surgery. He is willing to do that.  I have encouraged him to go through with plan  to quit smoking cigarettes completely.  Plan: 1. Continue Complera 2. Refill oxycodone 3. Referral for substance abuse counseling 4. Check lab work today 5. Cigarette cessation counseling provided 6. Followup in one month   Cliffton Asters, MD [facility] for Infectious Disease [facility] [phone] pager   [phone] cell 08/26/2013, 12:10 PM

## 2013-08-27 LAB — COMPREHENSIVE METABOLIC PANEL
Alkaline Phosphatase: 94 U/L (ref 39–117)
CO2: 28 mEq/L (ref 19–32)
Creat: 0.99 mg/dL (ref 0.50–1.35)
Glucose, Bld: 79 mg/dL (ref 70–99)
Total Bilirubin: 0.3 mg/dL (ref 0.3–1.2)

## 2013-08-27 LAB — T-HELPER CELL (CD4) - (RCID CLINIC ONLY)
CD4 % Helper T Cell: 33 % (ref 33–55)
CD4 T Cell Abs: 1020 /uL (ref 400–2700)

## 2013-08-27 LAB — DRUG SCREEN URINE W/ALC, PAIN MGMT, REFLEX
Amphetamine Screen, Ur: NEGATIVE
Creatinine,U: 108.31 mg/dL
Ethyl Alcohol: <10 mg/dL (ref <10)
Phencyclidine (PCP): NEGATIVE

## 2013-08-27 LAB — RPR

## 2013-08-27 LAB — ETHANOL: Alcohol, Ethyl (B): <10 mg/dL (ref 0–10)

## 2013-08-31 LAB — COCAINE METABOLITE (GC/LC/MS), URINE: Benzoylecgonine GC/MS Conf: 564 ng/mL — ABNORMAL HIGH

## 2013-09-14 ENCOUNTER — Other Ambulatory Visit: Payer: Self-pay | Admitting: Physician Assistant

## 2013-09-15 ENCOUNTER — Encounter (HOSPITAL_COMMUNITY)
Admission: RE | Admit: 2013-09-15 | Discharge: 2013-09-15 | Disposition: A | Discharge status: Home / Self Care | Payer: Medicare Other | Source: Ambulatory Visit | Attending: Physician Assistant | Admitting: Physician Assistant

## 2013-09-15 ENCOUNTER — Encounter (HOSPITAL_COMMUNITY): Payer: Self-pay

## 2013-09-15 ENCOUNTER — Encounter (HOSPITAL_COMMUNITY)
Admission: RE | Admit: 2013-09-15 | Discharge: 2013-09-15 | Disposition: A | Discharge status: Home / Self Care | Payer: Medicare Other | Source: Ambulatory Visit | Attending: Orthopedic Surgery | Admitting: Orthopedic Surgery

## 2013-09-15 DIAGNOSIS — Z01810 Encounter for preprocedural cardiovascular examination: Secondary | ICD-10-CM | POA: Insufficient documentation

## 2013-09-15 DIAGNOSIS — Z01818 Encounter for other preprocedural examination: Secondary | ICD-10-CM | POA: Insufficient documentation

## 2013-09-15 DIAGNOSIS — Z01812 Encounter for preprocedural laboratory examination: Secondary | ICD-10-CM | POA: Insufficient documentation

## 2013-09-15 DIAGNOSIS — Z01811 Encounter for preprocedural respiratory examination: Secondary | ICD-10-CM | POA: Insufficient documentation

## 2013-09-15 HISTORY — DX: Cannabis abuse, uncomplicated: F12.10

## 2013-09-15 HISTORY — DX: Cocaine abuse, uncomplicated: F14.10

## 2013-09-15 HISTORY — DX: Alcohol abuse, uncomplicated: F10.10

## 2013-09-15 LAB — COMPREHENSIVE METABOLIC PANEL
ALT: 8 U/L (ref 0–53)
Albumin: 3.6 g/dL (ref 3.5–5.2)
Alkaline Phosphatase: 91 U/L (ref 39–117)
BUN: 11 mg/dL (ref 6–23)
CALCIUM: 9.2 mg/dL (ref 8.4–10.5)
CHLORIDE: 102 meq/L (ref 96–112)
CO2: 28 meq/L (ref 19–32)
CREATININE: 1.07 mg/dL (ref 0.50–1.35)
GFR, EST NON AFRICAN AMERICAN: 82 mL/min — AB (ref >90)
Glucose, Bld: 98 mg/dL (ref 70–99)
Potassium: 4.1 mEq/L (ref 3.7–5.3)
Sodium: 143 mEq/L (ref 137–147)
Total Bilirubin: <0.2 mg/dL — ABNORMAL LOW (ref 0.3–1.2)
Total Protein: 7.8 g/dL (ref 6.0–8.3)

## 2013-09-15 LAB — CBC WITH DIFFERENTIAL/PLATELET
BASOS ABS: 0.1 10*3/uL (ref 0.0–0.1)
Basophils Relative: 1 % (ref 0–1)
EOS PCT: 4 % (ref 0–5)
Eosinophils Absolute: 0.2 10*3/uL (ref 0.0–0.7)
HEMATOCRIT: 43.4 % (ref 39.0–52.0)
HEMOGLOBIN: 15.2 g/dL (ref 13.0–17.0)
Lymphocytes Relative: 53 % — ABNORMAL HIGH (ref 12–46)
Lymphs Abs: 2.7 10*3/uL (ref 0.7–4.0)
MCH: 32.7 pg (ref 26.0–34.0)
MCHC: 35 g/dL (ref 30.0–36.0)
MCV: 93.3 fL (ref 78.0–100.0)
MONO ABS: 0.3 10*3/uL (ref 0.1–1.0)
Monocytes Relative: 7 % (ref 3–12)
NEUTROS ABS: 1.8 10*3/uL (ref 1.7–7.7)
Neutrophils Relative %: 36 % — ABNORMAL LOW (ref 43–77)
Platelets: 233 10*3/uL (ref 150–400)
RBC: 4.65 MIL/uL (ref 4.22–5.81)
RDW: 13.7 % (ref 11.5–15.5)
WBC: 5 10*3/uL (ref 4.0–10.5)

## 2013-09-15 LAB — SURGICAL PCR SCREEN
MRSA, PCR: NEGATIVE
Staphylococcus aureus: NEGATIVE

## 2013-09-15 LAB — URINALYSIS, ROUTINE W REFLEX MICROSCOPIC
BILIRUBIN URINE: NEGATIVE
GLUCOSE, UA: NEGATIVE mg/dL
Hgb urine dipstick: NEGATIVE
Ketones, ur: NEGATIVE mg/dL
LEUKOCYTES UA: NEGATIVE
Nitrite: NEGATIVE
PH: 6 (ref 5.0–8.0)
PROTEIN: NEGATIVE mg/dL
Specific Gravity, Urine: 1.014 (ref 1.005–1.030)
Urobilinogen, UA: 0.2 mg/dL (ref 0.0–1.0)

## 2013-09-15 LAB — TYPE AND SCREEN
ABO/RH(D): B POS
Antibody Screen: NEGATIVE

## 2013-09-15 LAB — APTT: aPTT: 28 seconds (ref 24–37)

## 2013-09-15 LAB — PROTIME-INR
INR: 1.01 (ref 0.00–1.49)
PROTHROMBIN TIME: 13.1 s (ref 11.6–15.2)

## 2013-09-15 LAB — ABO/RH: ABO/RH(D): B POS

## 2013-09-15 NOTE — Progress Notes (Signed)
Spoke to [place] ColonyAllison prior to [facility] regarding drug abuse hx. Per Revonda StandardAllison, PA no need to get UDS. During PAT patient reports that he used Cocaine around [place] and the he mainly uses it for pain. Per Revonda StandardAllison, [place] I advised patient that continued use of cocaine would put him at increased risk of anesthesia complications. I also advised him that cocaine affects the heart adversely. Patient stated he would not use between now and surgery.

## 2013-09-15 NOTE — Pre-Procedure Instructions (Signed)
Doris CheadleRobert L Westrup  09/15/2013   Your procedure is scheduled on:  January 16  [facility] [address] at [employer]05:30 AM.  Call this number if you have problems the morning of surgery: [phone]   Remember:   Do not eat food or drink liquids after midnight.   Take these medicines the morning of surgery with A SIP OF WATER: Oxycodone (if needed), Complera   STOP/ Do not take Aspirin, Aleve, Naproxen, Advil, Ibuprofen, Vitamin, Herbs, and Supplements starting January 9   Do not wear jewelry, make-up or nail polish.  Do not wear lotions, powders, or perfumes. You may wear deodorant.  Do not shave 48 hours prior to surgery. Men may shave face and neck.  Do not bring valuables to the hospital.  [facility] is not [facility] for any belongings or valuables.               Contacts, dentures or bridgework may not be worn into surgery.  Leave suitcase in the car. After surgery it may be brought to your room.  For patients admitted to the hospital, discharge time is determined by your treatment team.               Special Instructions: Shower using CHG 2 nights before surgery and the night before surgery.  If you shower the day of surgery use CHG.  Use special wash - you have one bottle of CHG for all showers.  You should use approximately 1/3 of the bottle for each shower.   Please read over the following fact sheets that you were given: Pain Booklet, Coughing and Deep Breathing, Blood Transfusion Information, Total Joint Packet and Surgical Site Infection Prevention

## 2013-09-16 ENCOUNTER — Ambulatory Visit: Payer: Self-pay | Admitting: *Deleted

## 2013-09-16 LAB — URINE CULTURE
CULTURE: NO GROWTH
Colony Count: NO GROWTH

## 2013-09-16 NOTE — Progress Notes (Addendum)
Anesthesia Chart Review:  Patient is a 48 year old male scheduled for right THA on 09/24/13 by Dr. Madelon Lipsaffrey.  History includes HIV, cerebral aneurysm repair, smoking, polysubstance abuse including cocaine, marijuana, ETOH. PCP is listed as Dr. Judyann Munsonynthia Snider with [facility]. He saw Dr. Cliffton AstersJohn Campbell on 08/26/13 for ID follow-up.  Notes indicate that he knew patient was told he needed right THA and also laser therapy for anal condyloma.  According to Epic, he is scheduled to see Dr. Ninetta LightsHatcher on 09/17/13.  He reported cocaine use on 09/09/13.  He knows he can not use any up thru surgery.  If any concern for more recent use, could consider drug screen preoperatively.  EKG on 09/15/13 showed SB, moderate voltage criteria for LVH, non-specific T wave abnormality.  Preoperative CXR and labs noted.  I'll review additional records as available.  Velna Ochsllison Shadasia Oldfield, PA-C [facility]iology Phone [phone] 09/16/2013 4:01 PM  Addendum: 09/21/2013 9:20 AM Patient was seen by ID Dr. Ninetta LightsHatcher yesterday for HIV follow-up and pre-operative risk assessment.  Patient was felt medically stable, but Dr. Ninetta LightsHatcher recommended drug toxicology screen either the day before or day of surgery to see if he needs to be assessed for withdrawal. Discussed with anesthesiologist Dr. Jacklynn BueMassagee who agrees with patient  assessment on the day of surgery regarding any recurrent cocaine use and any signs of acute intoxication.  If any, then would need to postpone surgery.  If none, anesthesiologist would likely defer UDS and proceed as planned.  Further evaluation and anesthesia plan on the day of surgery by his assigned anesthesiologist.

## 2013-09-17 ENCOUNTER — Ambulatory Visit: Payer: Self-pay | Admitting: Infectious Diseases

## 2013-09-20 ENCOUNTER — Ambulatory Visit (INDEPENDENT_AMBULATORY_CARE_PROVIDER_SITE_OTHER): Payer: Medicare Other | Admitting: Infectious Diseases

## 2013-09-20 ENCOUNTER — Encounter: Payer: Self-pay | Admitting: Infectious Diseases

## 2013-09-20 VITALS — BP 109/67 | HR 60 | Temp 97.5°F | Ht 69.0 in | Wt 171.0 lb

## 2013-09-20 DIAGNOSIS — F1910 Other psychoactive substance abuse, uncomplicated: Secondary | ICD-10-CM

## 2013-09-20 DIAGNOSIS — B20 Human immunodeficiency virus [HIV] disease: Secondary | ICD-10-CM

## 2013-09-20 DIAGNOSIS — Z23 Encounter for immunization: Secondary | ICD-10-CM

## 2013-09-20 DIAGNOSIS — M87051 Idiopathic aseptic necrosis of right femur: Secondary | ICD-10-CM

## 2013-09-20 DIAGNOSIS — M87059 Idiopathic aseptic necrosis of unspecified femur: Secondary | ICD-10-CM

## 2013-09-20 NOTE — Assessment & Plan Note (Addendum)
Appears to be doing well. Offered, refuses condoms. F/u as scheduled. Needs pnvx today.

## 2013-09-20 NOTE — Addendum Note (Signed)
Addended by: Wendall MolaOCKERHAM, JACQUELINE A on: 09/20/2013 03:03 PM   Modules accepted: Orders

## 2013-09-20 NOTE — Progress Notes (Signed)
   Subjective:    Patient ID: Michael BureauRobert L Girton, male    DOB: 22-Mar-1966, 48 y.o.   MRN: [id number]  HPI 48 yo M with hx of HIV+ 2000, cocaine abuse, AVN of R hip. Is planned for R THR on 09-24-13. Pain has been constant.  Is taking complera well (since 2013, his first rx). Denies missed doses.   HIV 1 RNA Quant (copies/mL)  Date Value  08/26/2013 26*  08/20/2012 <20   04/24/2012 <20      CD4 T Cell Abs (/uL)  Date Value  08/26/2013 1020   08/20/2012 1030   09/25/2011 910     Review of Systems  Constitutional: Negative for fever, chills, appetite change and unexpected weight change.  Respiratory: Negative for cough, chest tightness and shortness of breath.   Cardiovascular: Negative for chest pain and leg swelling.  Gastrointestinal: Negative for diarrhea and constipation.  Genitourinary: Negative for difficulty urinating.  Neurological: Negative for headaches.  Hematological: Negative for adenopathy. Does not bruise/bleed easily.       Objective:   Physical Exam  Constitutional: He appears well-developed and well-nourished.  HENT:  Mouth/Throat: No oropharyngeal exudate.  Eyes: EOM are normal. Pupils are equal, round, and reactive to light.  Neck: Normal range of motion. Neck supple.  Cardiovascular: Normal rate, regular rhythm and normal heart sounds.   Pulmonary/Chest: Effort normal and breath sounds normal.  Abdominal: Soft. Bowel sounds are normal. He exhibits no distension. There is no tenderness.  Musculoskeletal: He exhibits no edema.  Lymphadenopathy:    He has no cervical adenopathy.          Assessment & Plan:

## 2013-09-20 NOTE — Assessment & Plan Note (Signed)
I would repeat his drug tox screen day prior/day of surgery to see if he needs to be assessed for withdrawal. He states he has been drug free for 1 week. I re-iterated to him that he absolutely has to be drug free from this point on for his surgery.

## 2013-09-20 NOTE — Assessment & Plan Note (Addendum)
He has no risk factors by goldman/RCRI criteria, this puts his operative risk as low (0.4%). He is medically stable for surgery.   I would repeat his drug tox screen day prior/day of surgery to see if he needs to be assessed for withdrawal. He states he has been drug free for 1 week. I re-iterated to him that he absolutely has to be drug free from this point on for his surgery.

## 2013-09-23 ENCOUNTER — Other Ambulatory Visit: Payer: Self-pay | Admitting: Physician Assistant

## 2013-09-23 MED ORDER — CHLORHEXIDINE GLUCONATE 4 % EX LIQD: 60.0000 mL | Freq: Once | CUTANEOUS | Status: DC

## 2013-09-23 MED ORDER — CEFAZOLIN SODIUM-DEXTROSE 2-3 GM-% IV SOLR
2.0000 g | INTRAVENOUS | Status: AC
  Administered 2013-09-24: 2 g via INTRAVENOUS
  Filled 2013-09-23: qty 50

## 2013-09-23 MED ORDER — SODIUM CHLORIDE 0.9 % IV SOLN: INTRAVENOUS | Status: DC

## 2013-09-23 NOTE — H&P (Signed)
TOTAL HIP ADMISSION H&P  Patient is admitted for right total hip arthroplasty.  Subjective:  Chief Complaint: right hip pain  HPI: Michael [name], 47 y.o. male, has a history of pain and functional disability in the right hip(s) due to AVN and patient has failed non-surgical conservative treatments for greater than 12 weeks to include NSAID's and/or analgesics and activity modification.  Onset of symptoms was gradual starting 5 years ago with gradually worsening course since that time.The patient noted no past surgery on the right hip(s).  Patient currently rates pain in the right hip at 10 out of 10 with activity. Patient has night pain, worsening of pain with activity and weight bearing, pain that interfers with activities of daily living and pain with passive range of motion. Patient has evidence of AVN with collapse of femoral head causing secondary joint space narrowing and OA by imaging studies. This condition presents safety issues increasing the risk of falls.  There is no current active infection.  Patient Active Problem List   Diagnosis Date Noted  . Substance abuse 02/24/2013  . Gonorrhea 07/10/2012  . 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 Severe malnutrition 07/09/2012  . Epididymitis, right 07/08/2012  . Leukocytosis 07/08/2012  . Contact dermatitis 10/21/2011  . Periodontal disease 10/21/2011  . Warts 10/10/2011  . Underweight 03/22/2011  . DENTAL CARIES 01/19/2009  . HIV DISEASE 12/05/2008  . ACUTE GINGIVITIS PLAQUE INDUCED 12/05/2008  . ASEPTIC NECROSIS, FEMUR HEAD/NECK 12/05/2008  . PERSONAL HISTORY OF TRAUMATIC BRAIN INJURY 12/05/2008   Past Medical History  Diagnosis Date  . HIV disease   . Cocaine abuse   . Marijuana abuse   . ETOH abuse     Past Surgical History  Procedure Laterality Date  . Cerebral aneurysm repair  1995     (Not in a hospital admission) Allergies  Allergen Reactions  . Doxycycline Hives and Nausea And Vomiting  .  Hydrocodone-Acetaminophen Rash  . Levofloxacin Itching and Rash  . Phenytoin Rash    History  Substance Use Topics  . Smoking status: Current Every Day Smoker -- 0.10 packs/day for 22 years    Types: Cigarettes  . Smokeless tobacco: Never Used  . Alcohol Use: 0.0 oz/week     Comment: Report history last ETOH was around Christmas    Family History  Problem Relation Age of Onset  . Hypertension Mother   . Hepatitis C Father      Review of Systems  Constitutional: Negative.   HENT: Negative.   Eyes: Negative.   Respiratory: Negative.   Cardiovascular: Negative.   Gastrointestinal: Negative.   Genitourinary: Negative.   Musculoskeletal: Positive for joint pain. Negative for falls.  Skin: Negative.   Neurological: Positive for seizures. Negative for dizziness, tingling, tremors, sensory change, speech change, focal weakness and loss of consciousness.  Endo/Heme/Allergies: Negative.   Psychiatric/Behavioral: Positive for substance abuse. Negative for depression and suicidal ideas.    Objective:  Physical Exam  Constitutional: He is oriented to person, place, and time. He appears well-developed. No distress.  HENT:  Head: Normocephalic and atraumatic.  Nose: Nose normal.  Eyes: Conjunctivae and EOM are normal. Pupils are equal, round, and reactive to light.  Neck: Normal range of motion. Neck supple.  Cardiovascular: Normal rate, regular rhythm, normal heart sounds and intact distal pulses.   No murmur heard. Respiratory: Effort normal and breath sounds normal. No respiratory distress. He has no wheezes. He exhibits no tenderness.  GI: Soft. Bowel sounds are normal. He exhibits no   distension. There is no tenderness.  Musculoskeletal:       Right hip: He exhibits decreased range of motion, decreased strength, tenderness and bony tenderness.  Lymphadenopathy:    He has no cervical adenopathy.  Neurological: He is alert and oriented to person, place, and time. No cranial nerve  deficit.  Skin: Skin is warm and dry. No rash noted. No erythema.  Psychiatric: He has a normal mood and affect. His behavior is normal.    Vital signs in last 24 hours: @VSRANGES@  Labs:   Estimated body mass index is 25.24 kg/(m^2) as calculated from the following:   Height as of 09/20/13: 5' 9" (1.753 m).   Weight as of 09/20/13: 77.565 kg (171 lb).   Imaging Review Plain radiographs demonstrate severe degenerative joint disease of the right hip(s). The bone quality appears to be good for age and reported activity level.  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 The treatment options including medical management, injection therapy, arthroscopy and arthroplasty were discussed at length. The risks and benefits of total hip arthroplasty were presented and reviewed. The risks due to aseptic loosening, infection, stiffness, dislocation/subluxation,  thromboembolic complications and other imponderables were discussed.  The patient acknowledged the explanation, agreed to proceed with the plan and consent was signed. Patient is being admitted for inpatient treatment for surgery, pain control, PT, OT, prophylactic antibiotics, VTE prophylaxis, progressive ambulation and ADL's and discharge planning.The patient is planning to be discharged home with home health services. Patient is living with his mother in [place] and will plan on d/c home with HHPT likely on POD#2. 

## 2013-09-24 ENCOUNTER — Encounter (HOSPITAL_COMMUNITY)
Admission: RE | Disposition: A | Discharge status: Home Health | Payer: Self-pay | Source: Ambulatory Visit | Attending: Orthopedic Surgery

## 2013-09-24 ENCOUNTER — Encounter (HOSPITAL_COMMUNITY): Payer: Medicare Other | Admitting: Vascular Surgery

## 2013-09-24 ENCOUNTER — Encounter (HOSPITAL_COMMUNITY): Payer: Self-pay | Admitting: Anesthesiology

## 2013-09-24 ENCOUNTER — Inpatient Hospital Stay (HOSPITAL_COMMUNITY): Payer: Medicare Other

## 2013-09-24 ENCOUNTER — Inpatient Hospital Stay (HOSPITAL_COMMUNITY): Payer: Medicare Other | Admitting: Anesthesiology

## 2013-09-24 ENCOUNTER — Inpatient Hospital Stay (HOSPITAL_COMMUNITY)
Admission: RE | Admit: 2013-09-24 | Discharge: 2013-09-28 | DRG: 469 | Disposition: A | Discharge status: Home Health | Payer: Medicare Other | Source: Ambulatory Visit | Attending: Orthopedic Surgery | Admitting: Orthopedic Surgery

## 2013-09-24 DIAGNOSIS — M1610 Unilateral primary osteoarthritis, unspecified hip: Secondary | ICD-10-CM | POA: Diagnosis present

## 2013-09-24 DIAGNOSIS — F17200 Nicotine dependence, unspecified, uncomplicated: Secondary | ICD-10-CM | POA: Diagnosis present

## 2013-09-24 DIAGNOSIS — Z888 Allergy status to other drugs, medicaments and biological substances status: Secondary | ICD-10-CM

## 2013-09-24 DIAGNOSIS — B20 Human immunodeficiency virus [HIV] disease: Secondary | ICD-10-CM | POA: Diagnosis present

## 2013-09-24 DIAGNOSIS — M169 Osteoarthritis of hip, unspecified: Secondary | ICD-10-CM | POA: Diagnosis present

## 2013-09-24 DIAGNOSIS — G8918 Other acute postprocedural pain: Secondary | ICD-10-CM | POA: Diagnosis not present

## 2013-09-24 DIAGNOSIS — F1010 Alcohol abuse, uncomplicated: Secondary | ICD-10-CM | POA: Diagnosis present

## 2013-09-24 DIAGNOSIS — Z8249 Family history of ischemic heart disease and other diseases of the circulatory system: Secondary | ICD-10-CM

## 2013-09-24 DIAGNOSIS — F1210 Cannabis abuse, uncomplicated: Secondary | ICD-10-CM | POA: Diagnosis present

## 2013-09-24 DIAGNOSIS — F1410 Cocaine abuse, uncomplicated: Secondary | ICD-10-CM | POA: Diagnosis present

## 2013-09-24 DIAGNOSIS — Z881 Allergy status to other antibiotic agents status: Secondary | ICD-10-CM

## 2013-09-24 DIAGNOSIS — Z87820 Personal history of traumatic brain injury: Secondary | ICD-10-CM

## 2013-09-24 DIAGNOSIS — M87051 Idiopathic aseptic necrosis of right femur: Secondary | ICD-10-CM | POA: Diagnosis present

## 2013-09-24 DIAGNOSIS — M87059 Idiopathic aseptic necrosis of unspecified femur: Principal | ICD-10-CM | POA: Diagnosis present

## 2013-09-24 HISTORY — DX: Unspecified osteoarthritis, unspecified site: M19.90

## 2013-09-24 HISTORY — PX: TOTAL HIP ARTHROPLASTY: SHX124

## 2013-09-24 LAB — CBC
HEMATOCRIT: 38 % — AB (ref 39.0–52.0)
HEMOGLOBIN: 13 g/dL (ref 13.0–17.0)
MCH: 32.3 pg (ref 26.0–34.0)
MCHC: 34.2 g/dL (ref 30.0–36.0)
MCV: 94.5 fL (ref 78.0–100.0)
Platelets: 246 10*3/uL (ref 150–400)
RBC: 4.02 MIL/uL — ABNORMAL LOW (ref 4.22–5.81)
RDW: 13.6 % (ref 11.5–15.5)
WBC: 12.7 10*3/uL — AB (ref 4.0–10.5)

## 2013-09-24 LAB — CREATININE, SERUM
Creatinine, Ser: 0.94 mg/dL (ref 0.50–1.35)
GFR calc Af Amer: >90 mL/min (ref >90)
GFR calc non Af Amer: >90 mL/min (ref >90)

## 2013-09-24 SURGERY — ARTHROPLASTY, HIP, TOTAL,POSTERIOR APPROACH
Anesthesia: General | Site: Hip | Laterality: Right

## 2013-09-24 MED ORDER — PROPOFOL 10 MG/ML IV BOLUS
INTRAVENOUS | Status: DC | PRN
  Administered 2013-09-24: 290 mg via INTRAVENOUS

## 2013-09-24 MED ORDER — LIDOCAINE HCL (CARDIAC) 20 MG/ML IV SOLN
INTRAVENOUS | Status: DC | PRN
  Administered 2013-09-24 (×2): 50 mg via INTRAVENOUS

## 2013-09-24 MED ORDER — MENTHOL 3 MG MT LOZG: 1.0000 | LOZENGE | OROMUCOSAL | Status: DC | PRN

## 2013-09-24 MED ORDER — PHENOL 1.4 % MT LIQD: 1.0000 | OROMUCOSAL | Status: DC | PRN

## 2013-09-24 MED ORDER — ONDANSETRON HCL 4 MG PO TABS: 4.0000 mg | ORAL_TABLET | Freq: Four times a day (QID) | ORAL | Status: DC | PRN

## 2013-09-24 MED ORDER — ROCURONIUM BROMIDE 100 MG/10ML IV SOLN
INTRAVENOUS | Status: DC | PRN
  Administered 2013-09-24: 50 mg via INTRAVENOUS

## 2013-09-24 MED ORDER — METHOCARBAMOL 100 MG/ML IJ SOLN
500.0000 mg | Freq: Four times a day (QID) | INTRAVENOUS | Status: DC | PRN
  Administered 2013-09-24: 500 mg via INTRAVENOUS
  Filled 2013-09-24: qty 5

## 2013-09-24 MED ORDER — METHOCARBAMOL 750 MG PO TABS
750.0000 mg | ORAL_TABLET | Freq: Four times a day (QID) | ORAL | Status: DC | PRN
  Administered 2013-09-24 – 2013-09-28 (×11): 750 mg via ORAL
  Filled 2013-09-24 (×12): qty 1

## 2013-09-24 MED ORDER — BUPIVACAINE-EPINEPHRINE 0.5% -1:200000 IJ SOLN
INTRAMUSCULAR | Status: DC | PRN
  Administered 2013-09-24: 20 mL

## 2013-09-24 MED ORDER — SENNOSIDES-DOCUSATE SODIUM 8.6-50 MG PO TABS: 1.0000 | ORAL_TABLET | Freq: Every evening | ORAL | Status: DC | PRN

## 2013-09-24 MED ORDER — DIPHENHYDRAMINE HCL 12.5 MG/5ML PO ELIX
12.5000 mg | ORAL_SOLUTION | ORAL | Status: DC | PRN
Start: 2013-09-24 — End: 2013-09-28
  Administered 2013-09-24 – 2013-09-28 (×11): 25 mg via ORAL
  Filled 2013-09-24 (×11): qty 10

## 2013-09-24 MED ORDER — DEXTROSE 5 % IV SOLN
INTRAVENOUS | Status: DC | PRN
  Administered 2013-09-24: 07:00:00 via INTRAVENOUS

## 2013-09-24 MED ORDER — MIDAZOLAM HCL 5 MG/5ML IJ SOLN
INTRAMUSCULAR | Status: DC | PRN
  Administered 2013-09-24: 2 mg via INTRAVENOUS

## 2013-09-24 MED ORDER — ACETAMINOPHEN 325 MG PO TABS: 650.0000 mg | ORAL_TABLET | Freq: Four times a day (QID) | ORAL | Status: DC | PRN

## 2013-09-24 MED ORDER — SODIUM CHLORIDE 0.9 % IV SOLN
INTRAVENOUS | Status: DC
  Administered 2013-09-24: 21:00:00 via INTRAVENOUS
  Administered 2013-09-25: 30 mL/h via INTRAVENOUS

## 2013-09-24 MED ORDER — DOCUSATE SODIUM 100 MG PO CAPS
100.0000 mg | ORAL_CAPSULE | Freq: Two times a day (BID) | ORAL | Status: DC
  Administered 2013-09-24 – 2013-09-27 (×6): 100 mg via ORAL
  Filled 2013-09-24 (×10): qty 1

## 2013-09-24 MED ORDER — ENOXAPARIN SODIUM 30 MG/0.3ML ~~LOC~~ SOLN: 30.0000 mg | Freq: Two times a day (BID) | SUBCUTANEOUS | Status: DC

## 2013-09-24 MED ORDER — ACETAMINOPHEN 650 MG RE SUPP: 650.0000 mg | Freq: Four times a day (QID) | RECTAL | Status: DC | PRN

## 2013-09-24 MED ORDER — DEXAMETHASONE SODIUM PHOSPHATE 10 MG/ML IJ SOLN
INTRAMUSCULAR | Status: DC | PRN
  Administered 2013-09-24: 8 mg via INTRAVENOUS

## 2013-09-24 MED ORDER — METOCLOPRAMIDE HCL 10 MG PO TABS
5.0000 mg | ORAL_TABLET | Freq: Three times a day (TID) | ORAL | Status: DC | PRN
Start: 2013-09-24 — End: 2013-09-28

## 2013-09-24 MED ORDER — METHOCARBAMOL 750 MG PO TABS: 750.0000 mg | ORAL_TABLET | Freq: Four times a day (QID) | ORAL | Status: DC

## 2013-09-24 MED ORDER — METOCLOPRAMIDE HCL 5 MG/ML IJ SOLN: 5.0000 mg | Freq: Three times a day (TID) | INTRAMUSCULAR | Status: DC | PRN

## 2013-09-24 MED ORDER — BISACODYL 10 MG RE SUPP: 10.0000 mg | Freq: Every day | RECTAL | Status: DC | PRN

## 2013-09-24 MED ORDER — ENOXAPARIN SODIUM 30 MG/0.3ML ~~LOC~~ SOLN
30.0000 mg | Freq: Two times a day (BID) | SUBCUTANEOUS | Status: DC
  Administered 2013-09-25 – 2013-09-28 (×7): 30 mg via SUBCUTANEOUS
  Filled 2013-09-24 (×11): qty 0.3

## 2013-09-24 MED ORDER — HYDROMORPHONE HCL 2 MG PO TABS: ORAL_TABLET | ORAL | Status: DC

## 2013-09-24 MED ORDER — CEFAZOLIN SODIUM 1-5 GM-% IV SOLN
1.0000 g | Freq: Four times a day (QID) | INTRAVENOUS | Status: AC
  Administered 2013-09-24 (×2): 1 g via INTRAVENOUS
  Filled 2013-09-24 (×2): qty 50

## 2013-09-24 MED ORDER — HYDROMORPHONE HCL PF 1 MG/ML IJ SOLN
INTRAMUSCULAR | Status: AC
  Filled 2013-09-24: qty 2

## 2013-09-24 MED ORDER — KETOROLAC TROMETHAMINE 30 MG/ML IJ SOLN
INTRAMUSCULAR | Status: AC
  Filled 2013-09-24: qty 1

## 2013-09-24 MED ORDER — ACETAMINOPHEN 10 MG/ML IV SOLN
INTRAVENOUS | Status: AC
  Filled 2013-09-24: qty 100

## 2013-09-24 MED ORDER — FLEET ENEMA 7-19 GM/118ML RE ENEM: 1.0000 | ENEMA | Freq: Once | RECTAL | Status: AC | PRN

## 2013-09-24 MED ORDER — RILPIVIRINE HCL 25 MG PO TABS
25.0000 mg | ORAL_TABLET | Freq: Every day | ORAL | Status: DC
  Administered 2013-09-25: 25 mg via ORAL
  Filled 2013-09-24 (×3): qty 1

## 2013-09-24 MED ORDER — GLYCOPYRROLATE 0.2 MG/ML IJ SOLN
INTRAMUSCULAR | Status: DC | PRN
  Administered 2013-09-24: 0.6 mg via INTRAVENOUS

## 2013-09-24 MED ORDER — NEOSTIGMINE METHYLSULFATE 1 MG/ML IJ SOLN
INTRAMUSCULAR | Status: DC | PRN
  Administered 2013-09-24: 5 mg via INTRAVENOUS

## 2013-09-24 MED ORDER — ONDANSETRON HCL 4 MG/2ML IJ SOLN: 4.0000 mg | Freq: Four times a day (QID) | INTRAMUSCULAR | Status: DC | PRN

## 2013-09-24 MED ORDER — EMTRICITABINE-TENOFOVIR DF 200-300 MG PO TABS
1.0000 | ORAL_TABLET | Freq: Every day | ORAL | Status: DC
  Filled 2013-09-24 (×2): qty 1

## 2013-09-24 MED ORDER — BUPIVACAINE-EPINEPHRINE (PF) 0.5% -1:200000 IJ SOLN
INTRAMUSCULAR | Status: AC
  Filled 2013-09-24: qty 10

## 2013-09-24 MED ORDER — 0.9 % SODIUM CHLORIDE (POUR BTL) OPTIME
TOPICAL | Status: DC | PRN
  Administered 2013-09-24: 1000 mL

## 2013-09-24 MED ORDER — ARTIFICIAL TEARS OP OINT
TOPICAL_OINTMENT | OPHTHALMIC | Status: DC | PRN
  Administered 2013-09-24: 1 via OPHTHALMIC

## 2013-09-24 MED ORDER — ONDANSETRON HCL 4 MG/2ML IJ SOLN
INTRAMUSCULAR | Status: DC | PRN
  Administered 2013-09-24: 4 mg via INTRAVENOUS

## 2013-09-24 MED ORDER — HYDROMORPHONE HCL 2 MG PO TABS
2.0000 mg | ORAL_TABLET | ORAL | Status: DC | PRN
  Administered 2013-09-24 – 2013-09-25 (×6): 2 mg via ORAL
  Filled 2013-09-24 (×6): qty 1

## 2013-09-24 MED ORDER — HYDROMORPHONE HCL PF 1 MG/ML IJ SOLN
INTRAMUSCULAR | Status: DC | PRN
  Administered 2013-09-24 (×2): 0.5 mg via INTRAVENOUS

## 2013-09-24 MED ORDER — LACTATED RINGERS IV SOLN
INTRAVENOUS | Status: DC | PRN
  Administered 2013-09-24 (×2): via INTRAVENOUS

## 2013-09-24 MED ORDER — HYDROMORPHONE HCL PF 1 MG/ML IJ SOLN
1.0000 mg | INTRAMUSCULAR | Status: DC | PRN
  Administered 2013-09-24 – 2013-09-27 (×22): 1 mg via INTRAVENOUS
  Filled 2013-09-24 (×23): qty 1

## 2013-09-24 MED ORDER — FENTANYL CITRATE 0.05 MG/ML IJ SOLN
INTRAMUSCULAR | Status: DC | PRN
  Administered 2013-09-24 (×2): 100 ug via INTRAVENOUS
  Administered 2013-09-24 (×2): 50 ug via INTRAVENOUS
  Administered 2013-09-24: 100 ug via INTRAVENOUS
  Administered 2013-09-24 (×2): 50 ug via INTRAVENOUS

## 2013-09-24 MED ORDER — TEMAZEPAM 15 MG PO CAPS
15.0000 mg | ORAL_CAPSULE | Freq: Every evening | ORAL | Status: DC | PRN
  Administered 2013-09-27 (×3): 15 mg via ORAL
  Filled 2013-09-24 (×3): qty 1

## 2013-09-24 MED ORDER — KETOROLAC TROMETHAMINE 30 MG/ML IJ SOLN
30.0000 mg | Freq: Four times a day (QID) | INTRAMUSCULAR | Status: DC | PRN
  Administered 2013-09-24: 30 mg via INTRAVENOUS

## 2013-09-24 MED ORDER — ACETAMINOPHEN 10 MG/ML IV SOLN
INTRAVENOUS | Status: DC | PRN
  Administered 2013-09-24: 1000 mg via INTRAVENOUS

## 2013-09-24 MED ORDER — ONDANSETRON HCL 4 MG/2ML IJ SOLN: 4.0000 mg | Freq: Once | INTRAMUSCULAR | Status: DC | PRN

## 2013-09-24 MED ORDER — EMTRICITAB-RILPIVIR-TENOFOV DF 200-25-300 MG PO TABS: 1.0000 | ORAL_TABLET | Freq: Every day | ORAL | Status: DC

## 2013-09-24 MED ORDER — HYDROMORPHONE HCL PF 1 MG/ML IJ SOLN
0.2500 mg | INTRAMUSCULAR | Status: DC | PRN
  Administered 2013-09-24 (×4): 0.5 mg via INTRAVENOUS

## 2013-09-24 SURGICAL SUPPLY — 68 items
BLADE SAW SAG 73X25 THK (BLADE) ×2
BLADE SAW SGTL 73X25 THK (BLADE) ×1 IMPLANT
BRUSH FEMORAL CANAL (MISCELLANEOUS) IMPLANT
CAPT HIP PF COP ×3 IMPLANT
CLOTH BEACON ORANGE TIMEOUT ST (SAFETY) IMPLANT
COVER BACK TABLE 24X17X13 BIG (DRAPES) IMPLANT
COVER SURGICAL LIGHT HANDLE (MISCELLANEOUS) ×3 IMPLANT
DRAPE INCISE IOBAN 66X45 STRL (DRAPES) ×3 IMPLANT
DRAPE ORTHO SPLIT 77X108 STRL (DRAPES) ×4
DRAPE SURG ORHT 6 SPLT 77X108 (DRAPES) ×2 IMPLANT
DRAPE U-SHAPE 47X51 STRL (DRAPES) ×3 IMPLANT
DRSG ADAPTIC 3X8 NADH LF (GAUZE/BANDAGES/DRESSINGS) ×3 IMPLANT
DRSG PAD ABDOMINAL 8X10 ST (GAUZE/BANDAGES/DRESSINGS) IMPLANT
DURAPREP 26ML APPLICATOR (WOUND CARE) ×3 IMPLANT
ELECT CAUTERY BLADE 6.4 (BLADE) ×3 IMPLANT
ELECT REM PT RETURN 9FT ADLT (ELECTROSURGICAL) ×3
ELECTRODE REM PT RTRN 9FT ADLT (ELECTROSURGICAL) ×1 IMPLANT
EVACUATOR 1/8 PVC DRAIN (DRAIN) IMPLANT
FACESHIELD LNG OPTICON STERILE (SAFETY) ×3 IMPLANT
GLOVE BIO SURGEON STRL SZ7 (GLOVE) ×3 IMPLANT
GLOVE BIOGEL PI IND STRL 6.5 (GLOVE) ×2 IMPLANT
GLOVE BIOGEL PI IND STRL 7.0 (GLOVE) ×1 IMPLANT
GLOVE BIOGEL PI IND STRL 8 (GLOVE) ×2 IMPLANT
GLOVE BIOGEL PI INDICATOR 6.5 (GLOVE) ×4
GLOVE BIOGEL PI INDICATOR 7.0 (GLOVE) ×2
GLOVE BIOGEL PI INDICATOR 8 (GLOVE) ×4
GLOVE ORTHO TXT STRL SZ7.5 (GLOVE) ×6 IMPLANT
GLOVE SURG ORTHO 8.0 STRL STRW (GLOVE) ×6 IMPLANT
GLOVE SURG SS PI 6.5 STRL IVOR (GLOVE) ×3 IMPLANT
GOWN L4 LG 24 PK N/S (GOWN DISPOSABLE) ×3 IMPLANT
GOWN L4 XLG 20 PK N/S (GOWN DISPOSABLE) ×6 IMPLANT
GOWN PREVENTION PLUS XLARGE (GOWN DISPOSABLE) IMPLANT
GOWN STRL NON-REIN LRG LVL3 (GOWN DISPOSABLE) IMPLANT
GOWN STRL REUS W/TWL 2XL LVL3 (GOWN DISPOSABLE) ×3 IMPLANT
HANDPIECE INTERPULSE COAX TIP (DISPOSABLE)
HOOD PEEL AWAY FACE SHEILD DIS (HOOD) ×9 IMPLANT
IMMOBILIZER KNEE 20 (SOFTGOODS)
IMMOBILIZER KNEE 20 THIGH 36 (SOFTGOODS) IMPLANT
IMMOBILIZER KNEE 22 UNIV (SOFTGOODS) ×3 IMPLANT
IMMOBILIZER KNEE 24 THIGH 36 (MISCELLANEOUS) IMPLANT
IMMOBILIZER KNEE 24 UNIV (MISCELLANEOUS)
KIT BASIN OR (CUSTOM PROCEDURE TRAY) ×3 IMPLANT
KIT ROOM TURNOVER OR (KITS) ×3 IMPLANT
MANIFOLD NEPTUNE II (INSTRUMENTS) ×3 IMPLANT
NEEDLE 22X1 1/2 (OR ONLY) (NEEDLE) ×3 IMPLANT
NEEDLE MAYO TROCAR (NEEDLE) ×3 IMPLANT
NS IRRIG 1000ML POUR BTL (IV SOLUTION) ×3 IMPLANT
PACK TOTAL JOINT (CUSTOM PROCEDURE TRAY) ×3 IMPLANT
PAD ABD 8X10 STRL (GAUZE/BANDAGES/DRESSINGS) ×6 IMPLANT
PAD ARMBOARD 7.5X6 YLW CONV (MISCELLANEOUS) ×6 IMPLANT
PRESSURIZER FEMORAL UNIV (MISCELLANEOUS) IMPLANT
SET HNDPC FAN SPRY TIP SCT (DISPOSABLE) IMPLANT
SPONGE GAUZE 4X4 12PLY (GAUZE/BANDAGES/DRESSINGS) ×3 IMPLANT
STAPLER VISISTAT 35W (STAPLE) ×3 IMPLANT
SUCTION FRAZIER TIP 10 FR DISP (SUCTIONS) ×3 IMPLANT
SUT ETHIBOND NAB CT1 #1 30IN (SUTURE) ×12 IMPLANT
SUT VIC AB 1 CT1 27 (SUTURE)
SUT VIC AB 1 CT1 27XBRD ANBCTR (SUTURE) IMPLANT
SUT VIC AB 1 CTB1 27 (SUTURE) ×3 IMPLANT
SUT VIC AB 2-0 CT1 27 (SUTURE) ×2
SUT VIC AB 2-0 CT1 TAPERPNT 27 (SUTURE) ×1 IMPLANT
SYR CONTROL 10ML LL (SYRINGE) ×3 IMPLANT
TAPE CLOTH SURG 6X10 WHT LF (GAUZE/BANDAGES/DRESSINGS) ×3 IMPLANT
TOWEL OR 17X24 6PK STRL BLUE (TOWEL DISPOSABLE) ×3 IMPLANT
TOWEL OR 17X26 10 PK STRL BLUE (TOWEL DISPOSABLE) ×3 IMPLANT
TOWER CARTRIDGE SMART MIX (DISPOSABLE) IMPLANT
TRAY FOLEY CATH 16FRSI W/METER (SET/KITS/TRAYS/PACK) IMPLANT
WATER STERILE IRR 1000ML POUR (IV SOLUTION) ×3 IMPLANT

## 2013-09-24 NOTE — Preoperative (Signed)
Beta Blockers   Reason not to administer Beta Blockers:Not Applicable 

## 2013-09-24 NOTE — H&P (View-Only) (Signed)
TOTAL HIP ADMISSION H&P  Patient is admitted for right total hip arthroplasty.  Subjective:  Chief Complaint: right hip pain  HPI: Michael [name], 48 y.o. male, has a history of pain and functional disability in the right hip(s) due to AVN and patient has failed non-surgical conservative treatments for greater than 12 weeks to include NSAID's and/or analgesics and activity modification.  Onset of symptoms was gradual starting 5 years ago with gradually worsening course since that time.The patient noted no past surgery on the right hip(s).  Patient currently rates pain in the right hip at 10 out of 10 with activity. Patient has night pain, worsening of pain with activity and weight bearing, pain that interfers with activities of daily living and pain with passive range of motion. Patient has evidence of AVN with collapse of femoral head causing secondary joint space narrowing and OA by imaging studies. This condition presents safety issues increasing the risk of falls.  There is no current active infection.  Patient Active Problem List   Diagnosis Date Noted  . Substance abuse 02/24/2013  . Gonorrhea 07/10/2012  . 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 Severe malnutrition 07/09/2012  . Epididymitis, right 07/08/2012  . Leukocytosis 07/08/2012  . Contact dermatitis 10/21/2011  . Periodontal disease 10/21/2011  . Warts 10/10/2011  . Underweight 03/22/2011  . DENTAL CARIES 01/19/2009  . HIV DISEASE 12/05/2008  . ACUTE GINGIVITIS PLAQUE INDUCED 12/05/2008  . ASEPTIC NECROSIS, FEMUR HEAD/NECK 12/05/2008  . PERSONAL HISTORY OF TRAUMATIC BRAIN INJURY 12/05/2008   Past Medical History  Diagnosis Date  . HIV disease   . Cocaine abuse   . Marijuana abuse   . ETOH abuse     Past Surgical History  Procedure Laterality Date  . Cerebral aneurysm repair  1995     (Not in a hospital admission) Allergies  Allergen Reactions  . Doxycycline Hives and Nausea And Vomiting  .  Hydrocodone-Acetaminophen Rash  . Levofloxacin Itching and Rash  . Phenytoin Rash    History  Substance Use Topics  . Smoking status: Current Every Day Smoker -- 0.10 packs/day for 22 years    Types: Cigarettes  . Smokeless tobacco: Never Used  . Alcohol Use: 0.0 oz/week     Comment: Report history last ETOH was around Christmas    Family History  Problem Relation Age of Onset  . Hypertension Mother   . Hepatitis C Father      Review of Systems  Constitutional: Negative.   HENT: Negative.   Eyes: Negative.   Respiratory: Negative.   Cardiovascular: Negative.   Gastrointestinal: Negative.   Genitourinary: Negative.   Musculoskeletal: Positive for joint pain. Negative for falls.  Skin: Negative.   Neurological: Positive for seizures. Negative for dizziness, tingling, tremors, sensory change, speech change, focal weakness and loss of consciousness.  Endo/Heme/Allergies: Negative.   Psychiatric/Behavioral: Positive for substance abuse. Negative for depression and suicidal ideas.    Objective:  Physical Exam  Constitutional: He is oriented to person, place, and time. He appears well-developed. No distress.  HENT:  Head: Normocephalic and atraumatic.  Nose: Nose normal.  Eyes: Conjunctivae and EOM are normal. Pupils are equal, round, and reactive to light.  Neck: Normal range of motion. Neck supple.  Cardiovascular: Normal rate, regular rhythm, normal heart sounds and intact distal pulses.   No murmur heard. Respiratory: Effort normal and breath sounds normal. No respiratory distress. He has no wheezes. He exhibits no tenderness.  GI: Soft. Bowel sounds are normal. He exhibits no  distension. There is no tenderness.  Musculoskeletal:       Right hip: He exhibits decreased range of motion, decreased strength, tenderness and bony tenderness.  Lymphadenopathy:    He has no cervical adenopathy.  Neurological: He is alert and oriented to person, place, and time. No cranial nerve  deficit.  Skin: Skin is warm and dry. No rash noted. No erythema.  Psychiatric: He has a normal mood and affect. His behavior is normal.    Vital signs in last 24 hours: @VSRANGES @  Labs:   Estimated body mass index is 25.24 kg/(m^2) as calculated from the following:   Height as of 09/20/13: 5\' 9"  (1.753 m).   Weight as of 09/20/13: 77.565 kg (171 lb).   Imaging Review Plain radiographs demonstrate severe degenerative joint disease of the right hip(s). The bone quality appears to be good for age and reported activity level.  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 The treatment options including medical management, injection therapy, arthroscopy and arthroplasty were discussed at length. The risks and benefits of total hip arthroplasty were presented and reviewed. The risks due to aseptic loosening, infection, stiffness, dislocation/subluxation,  thromboembolic complications and other imponderables were discussed.  The patient acknowledged the explanation, agreed to proceed with the plan and consent was signed. Patient is being admitted for inpatient treatment for surgery, pain control, PT, OT, prophylactic antibiotics, VTE prophylaxis, progressive ambulation and ADL's and discharge planning.The patient is planning to be discharged home with home health services. Patient is living with his mother in [place] and will plan on d/c home with HHPT likely on POD#2.

## 2013-09-24 NOTE — Anesthesia Procedure Notes (Signed)
Procedure Name: Intubation Date/Time: 09/24/2013 7:40 AM Performed by: Wray KearnsFOLEY, Gordon Carlson A Pre-anesthesia Checklist: Patient identified, Timeout performed, Emergency Drugs available, Suction available and Patient being monitored Patient Re-evaluated:Patient Re-evaluated prior to inductionOxygen Delivery Method: Circle system utilized Preoxygenation: Pre-oxygenation with 100% oxygen Intubation Type: IV induction and Cricoid Pressure applied Ventilation: Mask ventilation without difficulty Laryngoscope Size: Mac and 4 Grade View: Grade I Tube type: Oral Tube size: 7.5 mm Number of attempts: 1 Airway Equipment and Method: Stylet Placement Confirmation: ETT inserted through vocal cords under direct vision,  breath sounds checked- equal and bilateral and positive ETCO2 Secured at: 23 cm Tube secured with: Tape Dental Injury: Teeth and Oropharynx as per pre-operative assessment

## 2013-09-24 NOTE — Progress Notes (Signed)
Patient refused Truvada and Edurant that pharmacy provided stating " I have my own medications and they are all one pill.  I don't want to mess my body up".  Medications forwarded to pharmacy.

## 2013-09-24 NOTE — Interval H&P Note (Signed)
History and Physical Interval Note:  09/24/2013 7:22 AM  Michael [name]  has presented today for surgery, with the diagnosis of OA RIGHT HIP  The various methods of treatment have been discussed with the patient and family. After consideration of risks, benefits and other options for treatment, the patient has consented to  Procedure(s): RIGHT TOTAL HIP ARTHROPLASTY (Right) as a surgical intervention .  The patient's history has been reviewed, patient examined, no change in status, stable for surgery.  I have reviewed the patient's chart and labs.  Questions were answered to the patient's satisfaction.     Jewelle Whitner JR,W D

## 2013-09-24 NOTE — Transfer of Care (Signed)
Immediate Anesthesia Transfer of Care Note  Patient: Michael CheadleRobert L Mccollum  Procedure(s) Performed: Procedure(s): RIGHT TOTAL HIP ARTHROPLASTY (Right)  Patient Location: PACU  Anesthesia Type:General  Level of Consciousness: sedated, patient cooperative and responds to stimulation  Airway & Oxygen Therapy: Patient Spontanous Breathing and Patient connected to nasal cannula oxygen  Post-op Assessment: Report given to PACU RN, Post -op Vital signs reviewed and stable, Patient moving all extremities and Patient moving all extremities X 4  Post vital signs: Reviewed and stable  Complications: No apparent anesthesia complications

## 2013-09-24 NOTE — Discharge Instructions (Signed)
Diet: As you were doing prior to hospitalization  ° °Activity:  Increase activity slowly as tolerated  °                No lifting or driving for 6 weeks ° °Shower:  May shower without a dressing starting Wed., NO SOAKING IN TUB ° °Dressing:  You may change your dressing on Monday °                   Then change the dressing daily with sterile 4"x4"s gauze dressing  °                   And TED hose for knees.  Use paper tape to hold dressing in place  °                   For hips.  You may clean the incision with alcohol prior to redressing. ° °Weight Bearing:   weight bearing as taught in physical therapy.  Use a walker or  °                  Crutches as instructed. ° °To prevent constipation: you may use a stool softener such as - °              Colace ( over the counter) 100 mg by mouth twice a day  °              Drink plenty of fluids ( prune juice may be helpful) and high fiber foods °               Miralax ( over the counter) for constipation as needed.   ° °Precautions:  If you experience chest pain or shortness of breath - call 911 immediately               For transfer to the hospital emergency department!! °              If you develop a fever greater that 101 F, purulent drainage from wound,                             increased redness or drainage from wound, or calf pain -- Call the office  ° °Follow- Up Appointment:  Please call for an appointment to be seen in 2 weeks °              [facility] - [phone] ° ° ° °

## 2013-09-24 NOTE — Brief Op Note (Signed)
09/24/2013  10:05 AM  PATIENT:  Michael [name]  48 y.o. male  PRE-OPERATIVE DIAGNOSIS:  OA RIGHT HIP  POST-OPERATIVE DIAGNOSIS:  OA RIGHT HIP  PROCEDURE:  Procedure(s): RIGHT TOTAL HIP ARTHROPLASTY (Right)  SURGEON:  Surgeon(s) and Role:    * W D Carloyn Manneraffrey Jr., MD - Primary  PHYSICIAN ASSISTANT: Margart SicklesJoshua Susana Gripp, PA-C  ASSISTANTS:    ANESTHESIA:   local and general  EBL:  Total I/O In: 1550 [I.V.:1550] Out: 400 [Blood:400]  BLOOD ADMINISTERED:none  DRAINS: none   LOCAL MEDICATIONS USED:  MARCAINE     SPECIMEN:  No Specimen  DISPOSITION OF SPECIMEN:  N/A  COUNTS:  YES  TOURNIQUET:  * No tourniquets in log *  DICTATION: .Other Dictation: Dictation Number unknown  PLAN OF CARE: Admit to inpatient   PATIENT DISPOSITION:  PACU - hemodynamically stable.   Delay start of Pharmacological VTE agent (>24hrs) due to surgical blood loss or risk of bleeding: yes

## 2013-09-24 NOTE — Anesthesia Postprocedure Evaluation (Signed)
  Anesthesia Post-op Note  Patient: Michael CheadleRobert L Cajamarca  Procedure(s) Performed: Procedure(s): RIGHT TOTAL HIP ARTHROPLASTY (Right)  Patient Location: PACU  Anesthesia Type:General  Level of Consciousness: awake, alert  and oriented  Airway and Oxygen Therapy: Patient Spontanous Breathing  Post-op Pain: moderate  Post-op Assessment: Post-op Vital signs reviewed, Patient's Cardiovascular Status Stable, Respiratory Function Stable, Patent Airway, No signs of Nausea or vomiting and Pain level controlled  Post-op Vital Signs: Reviewed and stable  Complications: No apparent anesthesia complications

## 2013-09-24 NOTE — Anesthesia Preprocedure Evaluation (Signed)
Anesthesia Evaluation  Patient identified by MRN, date of birth, ID band Patient awake    Reviewed: Allergy & Precautions, H&P , NPO status , Patient's Chart, lab work & pertinent test results  History of Anesthesia Complications Negative for: history of anesthetic complications  Airway Mallampati: I TM Distance: >3 FB Neck ROM: Full    Dental  (+) Teeth Intact and Missing   Pulmonary Current Smoker,    Pulmonary exam normal       Cardiovascular Exercise Tolerance: Poor METS: 3 - Mets - angina- DOE - Valvular Problems/MurmursRhythm:Regular Rate:Normal     Neuro/Psych 1995 brain surgery for blood clot after skull fracture. Seizure at the time. No seizures since and not currently on antiepileptics negative psych ROS   GI/Hepatic negative GI ROS, Neg liver ROS,   Endo/Other  negative endocrine ROS  Renal/GU negative Renal ROS  negative genitourinary   Musculoskeletal  (+) Arthritis -, Osteoarthritis,    Abdominal   Peds  Hematology  (+) HIV,   Anesthesia Other Findings   Reproductive/Obstetrics                           Anesthesia Physical Anesthesia Plan  ASA: II  Anesthesia Plan: General   Post-op Pain Management:    Induction: Intravenous  Airway Management Planned: Oral ETT  Additional Equipment: None  Intra-op Plan:   Post-operative Plan: Extubation in OR  Informed Consent: I have reviewed the patients History and Physical, chart, labs and discussed the procedure including the risks, benefits and alternatives for the proposed anesthesia with the patient or authorized representative who has indicated his/her understanding and acceptance.     Plan Discussed with: CRNA and Surgeon  Anesthesia Plan Comments:         Anesthesia Quick Evaluation

## 2013-09-25 LAB — CBC
HCT: 31.3 % — ABNORMAL LOW (ref 39.0–52.0)
HEMOGLOBIN: 10.8 g/dL — AB (ref 13.0–17.0)
MCH: 32.6 pg (ref 26.0–34.0)
MCHC: 34.5 g/dL (ref 30.0–36.0)
MCV: 94.6 fL (ref 78.0–100.0)
Platelets: 232 10*3/uL (ref 150–400)
RBC: 3.31 MIL/uL — ABNORMAL LOW (ref 4.22–5.81)
RDW: 13.8 % (ref 11.5–15.5)
WBC: 9.8 10*3/uL (ref 4.0–10.5)

## 2013-09-25 LAB — BASIC METABOLIC PANEL
BUN: 15 mg/dL (ref 6–23)
CO2: 26 mEq/L (ref 19–32)
Calcium: 8.5 mg/dL (ref 8.4–10.5)
Chloride: 103 mEq/L (ref 96–112)
Creatinine, Ser: 0.91 mg/dL (ref 0.50–1.35)
GFR calc non Af Amer: >90 mL/min (ref >90)
Glucose, Bld: 123 mg/dL — ABNORMAL HIGH (ref 70–99)
Potassium: 4.5 mEq/L (ref 3.7–5.3)
Sodium: 140 mEq/L (ref 137–147)

## 2013-09-25 MED ORDER — RILPIVIRINE HCL 25 MG PO TABS
25.0000 mg | ORAL_TABLET | Freq: Every day | ORAL | Status: DC
  Administered 2013-09-26 – 2013-09-28 (×3): 25 mg via ORAL
  Filled 2013-09-25 (×4): qty 1

## 2013-09-25 MED ORDER — EMTRICITABINE-TENOFOVIR DF 200-300 MG PO TABS
1.0000 | ORAL_TABLET | Freq: Every day | ORAL | Status: DC
  Administered 2013-09-26 – 2013-09-28 (×3): 1 via ORAL
  Filled 2013-09-25 (×4): qty 1

## 2013-09-25 MED ORDER — HYDROMORPHONE HCL 2 MG PO TABS
2.0000 mg | ORAL_TABLET | ORAL | Status: DC | PRN
  Administered 2013-09-25: 4 mg via ORAL
  Administered 2013-09-25 – 2013-09-27 (×11): 2 mg via ORAL
  Administered 2013-09-27 (×2): 4 mg via ORAL
  Administered 2013-09-27: 2 mg via ORAL
  Administered 2013-09-27 – 2013-09-28 (×4): 4 mg via ORAL
  Filled 2013-09-25: qty 1
  Filled 2013-09-25: qty 2
  Filled 2013-09-25 (×2): qty 1
  Filled 2013-09-25 (×2): qty 2
  Filled 2013-09-25 (×5): qty 1
  Filled 2013-09-25: qty 2
  Filled 2013-09-25: qty 1
  Filled 2013-09-25: qty 2
  Filled 2013-09-25: qty 1
  Filled 2013-09-25 (×2): qty 2
  Filled 2013-09-25 (×3): qty 1

## 2013-09-25 NOTE — Evaluation (Signed)
Physical Therapy Evaluation Patient Details Name: Michael [name] MRN: [id number] DOB: 1965/11/30 Today's Date: 09/25/2013 Time: [id number] PT Time Calculation (min): 33 min  PT Assessment / Plan / Recommendation History of Present Illness  Pt is a 48 y/o male admitted with THA posterior approach.   Clinical Impression  This patient presents with acute pain and decreased functional independence following the above mentioned procedure. At the time of PT eval, pt was very self-limiting, stating 10/10 pain when he did not appear to be in any distress. Once up and ambulating, pt started to push himself a little more and rated 8/10 pain. This patient is appropriate for skilled PT interventions to address functional limitations, improve safety and independence with functional mobility, and return to PLOF.     PT Assessment  Patient needs continued PT services    Follow Up Recommendations  Home health PT    Does the patient have the potential to tolerate intense rehabilitation      Barriers to Discharge        Equipment Recommendations  Rolling walker with 5" wheels;3in1 (PT)    Recommendations for Other Services     Frequency 7X/week    Precautions / Restrictions Precautions Precautions: Fall;Posterior Hip Precaution Booklet Issued: Yes (comment) Precaution Comments: Discussed with pt and wife in detail Restrictions Weight Bearing Restrictions: Yes RLE Weight Bearing: Weight bearing as tolerated   Pertinent Vitals/Pain See clinical impression for pain rating      Mobility  Bed Mobility Overal bed mobility: Needs Assistance Bed Mobility: Supine to Sit Supine to sit: Min assist General bed mobility comments: Assist for movement and support of RLE. VC's for sequencing and technique. Transfers Overall transfer level: Needs assistance Equipment used: Rolling walker (2 wheeled) Transfers: Sit to/from Stand Sit to Stand: Min assist General transfer comment: Assist to come to  full stand, with VC's for hand placement on seated surface for safety.  Ambulation/Gait Ambulation/Gait assistance: Min guard Ambulation Distance (Feet): 80 Feet Assistive device: Rolling walker (2 wheeled) Gait Pattern/deviations: Step-to pattern;Decreased stride length;Trunk flexed Gait velocity: Decreased Gait velocity interpretation: Below normal speed for age/gender General Gait Details: VC's for sequencing and safety awareness with the RW. Pt also cued for improved posture as trunk was flexed mostly during gait training.     Exercises General Exercises - Lower Extremity Ankle Circles/Pumps: 10 reps Quad Sets: 10 reps   PT Diagnosis: Difficulty walking;Acute pain  PT Problem List: Decreased strength;Decreased range of motion;Decreased activity tolerance;Decreased balance;Decreased mobility;Decreased knowledge of use of DME;Decreased safety awareness;Pain;Decreased knowledge of precautions PT Treatment Interventions: DME instruction;Gait training;Functional mobility training;Stair training;Therapeutic activities;Therapeutic exercise;Neuromuscular re-education;Patient/family education     PT Goals(Current goals can be found in the care plan section) Acute Rehab PT Goals Patient Stated Goal: To return home to his infant son PT Goal Formulation: With patient/family Time For Goal Achievement: 10/09/13 Potential to Achieve Goals: Good  Visit Information  Last PT Received On: 09/25/13 Assistance Needed: +1 History of Present Illness: Pt is a 48 y/o male admitted with THA posterior approach.        Prior Functioning  Home Living Family/patient expects to be discharged to:: Private residence Living Arrangements: Parent;Spouse/significant other Available Help at Discharge: Family;Available 24 hours/day Type of Home: House Home Access: Stairs to enter Entergy CorporationEntrance Stairs-Number of Steps: 1 Entrance Stairs-Rails: None Home Layout: One level Home Equipment: Cane - single  point Additional Comments: Tub shower Prior Function Level of Independence: Independent with assistive device(s) Communication Communication: No difficulties Dominant Hand: Right  Cognition  Cognition Arousal/Alertness: Awake/alert Behavior During Therapy: WFL for tasks assessed/performed Overall Cognitive Status: Within Functional Limits for tasks assessed    Extremity/Trunk Assessment Upper Extremity Assessment Upper Extremity Assessment: Overall WFL for tasks assessed Lower Extremity Assessment Lower Extremity Assessment: Overall WFL for tasks assessed;RLE deficits/detail RLE Deficits / Details: Decreased strength and AROM consistent with THA RLE: Unable to fully assess due to pain Cervical / Trunk Assessment Cervical / Trunk Assessment: Normal   Balance Balance Overall balance assessment: No apparent balance deficits (not formally assessed)  End of Session PT - End of Session Equipment Utilized During Treatment: Gait belt Activity Tolerance: Patient tolerated treatment well Patient left: in chair;with call bell/phone within reach;with family/visitor present Nurse Communication: Mobility status  GP     Ruthann Cancer 09/25/2013, 11:07 AM  Ruthann Cancer, PT, DPT [phone]

## 2013-09-25 NOTE — Op Note (Signed)
NAME:  Michael EwingsNNIN, Horton               ACCOUNT NO.:  [id number]  MEDICAL RECORD NO.:  [id number]  LOCATION:  [id number]                        FACILITY:  [facility]  PHYSICIAN:  Dyke BrackettW. D. Reynolds Kittel, M.D.    DATE OF BIRTH:  July 01, 1966  DATE OF PROCEDURE:  09/24/2013 DATE OF DISCHARGE:                              OPERATIVE REPORT   PREOPERATIVE DIAGNOSIS:  Avascular necrosis with superimposed osteoarthritis, right hip.  POSTOPERATIVE DIAGNOSIS:  Avascular necrosis with superimposed osteoarthritis, right hip.  OPERATION:  Right total hip replacement (AML 13.5 small stature, +5 mm neck length, 36-mm hip ball, ceramic head, 54-mm Gription cup, Sector, with Marathon 10-degree lip liner).  SURGEON:  Dyke BrackettW. D. Esker Dever, M.D.  ASSISTANT:  Margart SicklesJoshua Chadwell, PA-C.  BLOOD LOSS:  300 mL.  DESCRIPTION OF PROCEDURE:  Sterile prep and drape, lateral position of posterior approach hip made, splitting the iliotibial band, short external rotators.  We dislocated the hip after making the T- capsulotomy, cut the head about 1 fingerbreadth above the lesser trochanter, progressively reamed line-to-line to 13.5 mm with the appropriate broaching, 13.5, small stature.  Attention was next directed to the acetabulum, retractors were placed anteriorly, inferiorly as well as wing retractors.  Exposure of the acetabulum with debridement of the labrum was carried out.  We reamed it about 55 degrees, abduction 10-15 degrees anteversion.  Trialed the 54 cup, which did not sit and then placed the final Sector Gription cup in the same position.  We then placed a trial liner followed by reduction of the hip, trialing the prosthesis with +5 and +1.5 lengths.  We felt like the best stability, restoration of leg lengths was with the +5 mm. We then removed the trial poly, placed the apex screw as well as the final poly with a lip liner to about the 10 o'clock position.  We then placed the final prosthesis, trialed off that, eventually  settling as I mentioned on the +5 36-mm ceramic hip ball.  Stability was excellent. He had a safe zone up until 100 degrees flexion, few weight, full adduction and then internally rotate approximately to 60-75 degrees before the hip would start to dislocate.  Wound was irrigated.  Closure was affected with #1 running Ethibond on the iliotibial band.  We did close the T-capsulotomy with the same suture with 2-0 Vicryl and skin clips.  Taken to the recovery room in stable condition.    Dyke BrackettW. D. Wendy Mikles, M.D.    WDC/MEDQ  D:  09/24/2013  T:  09/25/2013  Job:  [id number]

## 2013-09-25 NOTE — Progress Notes (Addendum)
Patient called CN RN into room.  When I went into room, patient was very agitated and upset stating "I am supposed to get my IV pain medication four hours ago.  I don't understand why I can't get it!"  He went onto explain to me "last night the night time nurse had no problem giving me my medicine every hour and I've had to wait for over four hours to get any pain medicine!"  I attempted to explain to patient that he received his PO pain medication at 8:45am.  His nurse was with another patient and would be with him as soon as she could.  I also stated to patient that maybe we needed to call the MD and get another pain medication because the medication he has ordered doesn't seem like it's working.  Maybe we needed to get an additional pain medication on top of what he has.  He then screamed "Why are you gonna do that?! Why are you catching an attitude with me!  See, nobody is taking care of me or cares about my serious surgery I had! I am in serious pain!"  I asked patient several times to rate his pain, location and descriptions of it and he declined.  At that time he was contradicting all of his previous statements, so [facility] left the room [facility]  His staff RN updated.

## 2013-09-25 NOTE — Progress Notes (Signed)
Physical Therapy Treatment Patient Details Name: Michael [name] MRN: [id number] DOB: 21-Oct-1965 Today's Date: 09/25/2013 Time: [id number] PT Time Calculation (min): 29 min  PT Assessment / Plan / Recommendation  History of Present Illness Pt is a 48 y/o male admitted with THA posterior approach.    PT Comments   Pt is progressing well towards physical therapy goals. Significant other (ex-wife?) states she will be caring for him at home, and education was provided regarding safe handling during transfers and ambulation. Pt requires min assist occasionally during functional mobility, however continues to be self-limiting due to reported pain. Therapist provided moderate encouragement for participation at beginning of session.   Follow Up Recommendations  Home health PT     Does the patient have the potential to tolerate intense rehabilitation     Barriers to Discharge        Equipment Recommendations  Rolling walker with 5" wheels;3in1 (PT)    Recommendations for Other Services    Frequency 7X/week   Progress towards PT Goals Progress towards PT goals: Progressing toward goals  Plan Current plan remains appropriate    Precautions / Restrictions Precautions Precautions: Fall;Posterior Hip Precaution Booklet Issued: Yes (comment) Precaution Comments: Discussed with pt and wife in detail Restrictions Weight Bearing Restrictions: Yes RLE Weight Bearing: Weight bearing as tolerated   Pertinent Vitals/Pain 8/10 pain at rest. States he needs more pain medicine, however prior to session RN told me he was not due for more medication until later in the afternoon, and he was aware of this.     Mobility  Bed Mobility Overal bed mobility: Needs Assistance Bed Mobility: Supine to Sit;Sit to Supine Supine to sit: Min assist Sit to supine: Min assist General bed mobility comments: Assist for movement and support of RLE during supine<>sit. VC's for sequencing and  technique. Transfers Overall transfer level: Needs assistance Equipment used: Rolling walker (2 wheeled) Transfers: Sit to/from Stand Sit to Stand: Min guard General transfer comment: VC's for hand placement on seated surface for safety.  Ambulation/Gait Ambulation/Gait assistance: Min guard Ambulation Distance (Feet): 100 Feet Assistive device: Rolling walker (2 wheeled) Gait Pattern/deviations: Step-through pattern;Decreased stride length;Narrow base of support Gait velocity: Decreased Gait velocity interpretation: Below normal speed for age/gender General Gait Details: VC's for sequencing and safety awareness with the RW. Pt also cued for improved posture as trunk was flexed mostly during gait training. Encouraged step-through gait pattern and equal step-lengths    Exercises General Exercises - Lower Extremity Ankle Circles/Pumps: 10 reps Quad Sets: 10 reps Short Arc Quad: 10 reps Heel Slides: 10 reps Hip ABduction/ADduction: 10 reps   PT Diagnosis:    PT Problem List:   PT Treatment Interventions:     PT Goals (current goals can now be found in the care plan section) Acute Rehab PT Goals Patient Stated Goal: To return home to his infant son PT Goal Formulation: With patient/family Time For Goal Achievement: 10/09/13 Potential to Achieve Goals: Good  Visit Information  Last PT Received On: 09/25/13 Assistance Needed: +1 History of Present Illness: Pt is a 48 y/o male admitted with THA posterior approach.     Subjective Data  Subjective: "I am really hurting, I'm going to need something for this." Patient Stated Goal: To return home to his infant son   Cognition  Cognition Arousal/Alertness: Awake/alert Behavior During Therapy: WFL for tasks assessed/performed Overall Cognitive Status: Within Functional Limits for tasks assessed    Balance  Balance Overall balance assessment: No apparent balance deficits (not  formally assessed)  End of Session PT - End of  Session Equipment Utilized During Treatment: Gait belt Activity Tolerance: Patient tolerated treatment well Patient left: in bed;with call bell/phone within reach;with family/visitor present Nurse Communication: Mobility status   GP     Ruthann Cancer 09/25/2013, 2:41 PM  Ruthann Cancer, PT, DPT [phone]

## 2013-09-25 NOTE — Evaluation (Signed)
Occupational Therapy Evaluation Patient Details Name: Michael BureauRobert L [name] MRN: [id number] DOB: 1966/03/19 Today's Date: 09/25/2013 Time: [id number] OT Time Calculation (min): 21 min  OT Assessment / Plan / Recommendation History of present illness Pt is a 48 y/o male admitted with THA posterior approach.    Clinical Impression   Pt presents with below problem list. Pt independent with ADLs, PTA. Feel pt will benefit from acute OT to increase independence prior to d/c.     OT Assessment  Patient needs continued OT Services    Follow Up Recommendations  Home health OT;Supervision - Intermittent    Barriers to Discharge      Equipment Recommendations  3 in 1 bedside comode;Other (comment) (AE-long handled sponge, long shoehorn, reacher, sockaid)    Recommendations for Other Services    Frequency  Min 2X/week    Precautions / Restrictions Precautions Precautions: Fall;Posterior Hip Precaution Booklet Issued: No Precaution Comments: Reviewed precautions with pt Restrictions Weight Bearing Restrictions: Yes RLE Weight Bearing: Weight bearing as tolerated   Pertinent Vitals/Pain Pain 10/10 in RLE. Repositioned.     ADL  Grooming: Set up Where Assessed - Grooming: Supported sitting Upper Body Dressing: Set up;Supervision/safety Where Assessed - Upper Body Dressing: Supported sitting Lower Body Dressing: Minimal assistance (with AE) Where Assessed - Lower Body Dressing: Supported sit to stand Toilet Transfer: Hydrographic surveyorMin guard Toilet Transfer Method: Sit to Barist[place]and Toilet Transfer Equipment: Raised toilet seat with arms (or 3-in-1 over toilet) Toileting - Clothing Manipulation and Hygiene: Min guard Where Assessed - Engineer, miningToileting Clothing Manipulation and Hygiene: Standing Tub/Shower Transfer Method: Not assessed Equipment Used: Gait belt;Rolling walker;Long-handled sponge;Long-handled shoe horn;Reacher;Sock aid Transfers/Ambulation Related to ADLs: Min guard-cues for technique with  ambulation.  ADL Comments: Educated on AE for LB ADLs. Pt practiced donning/doffing socks with reacher and sockaid. Recommended sitting to bathe and for dressing. Educated on dressing technique. Discussed use of bag on walker.    OT Diagnosis: Acute pain  OT Problem List: Decreased strength;Decreased activity tolerance;Impaired balance (sitting and/or standing);Decreased knowledge of use of DME or AE;Decreased knowledge of precautions;Pain OT Treatment Interventions: Self-care/ADL training;DME and/or AE instruction;Therapeutic activities;Patient/family education;Balance training   OT Goals(Current goals can be found in the care plan section) Acute Rehab OT Goals Patient Stated Goal: not stated OT Goal Formulation: With patient Time For Goal Achievement: 10/02/13 Potential to Achieve Goals: Good ADL Goals Pt Will Perform Lower Body Dressing: with modified independence;with adaptive equipment;sit to/from stand Pt Will Transfer to Toilet: with modified independence;ambulating (3 in 1 over commode) Pt Will Perform Toileting - Clothing Manipulation and hygiene: with modified independence;sit to/from stand Pt Will Perform Tub/Shower Transfer: Tub transfer;with supervision;rolling walker;ambulating (tub equipment tbd) Additional ADL Goal #1: Pt will independently verbalize and demonstrate 3/3 hip precautions.    Visit Information  Last OT Received On: 09/25/13 Assistance Needed: +1 History of Present Illness: Pt is a 48 y/o male admitted with THA posterior approach.        Prior Functioning     Home Living Family/patient expects to be discharged to:: Private residence Living Arrangements: Parent;Spouse/significant other Available Help at Discharge: Family;Available 24 hours/day Type of Home: House Home Access: Stairs to enter Entergy CorporationEntrance Stairs-Number of Steps: 1 Entrance Stairs-Rails: None Home Layout: One level Home Equipment: Cane - single point Additional Comments: Tub shower Prior  Function Level of Independence: Independent with assistive device(s) Communication Communication: No difficulties Dominant Hand: Right         Vision/Perception     Cognition  Cognition Arousal/Alertness: Awake/alert Behavior  During Therapy: WFL for tasks assessed/performed Overall Cognitive Status: Within Functional Limits for tasks assessed    Extremity/Trunk Assessment Upper Extremity Assessment Upper Extremity Assessment: Overall WFL for tasks assessed Lower Extremity Assessment Lower Extremity Assessment: Defer to PT evaluation RLE Deficits / Details: Decreased strength and AROM consistent with THA RLE: Unable to fully assess due to pain Cervical / Trunk Assessment Cervical / Trunk Assessment: Normal     Mobility  Transfers Overall transfer level: Needs assistance Equipment used: Rolling walker (2 wheeled) Transfers: Sit to/from Stand Sit to Stand: Min guard General transfer comment: Cues for positioning of RLE and technique.           End of Session OT - End of Session Equipment Utilized During Treatment: Gait belt;Rolling walker Activity Tolerance: Patient tolerated treatment well Patient left: in chair;with call bell/phone within reach;with family/visitor present  Lorri Frederick OTR/L [phone] 09/25/2013, 12:59 PM

## 2013-09-25 NOTE — Progress Notes (Signed)
Subjective: 1 Day Post-Op Procedure(s) (LRB): RIGHT TOTAL HIP ARTHROPLASTY (Right) Patient reports pain as 5 on 0-10 scale.  Patient appears to be doing well.  No fever/chills, nausea/vomiting.  Positive flatus, no bm.  Tolerating diet.    Objective: Vital signs in last 24 hours: Temp:  [97.7 F (36.5 C)-98.5 F (36.9 C)] 98.5 F (36.9 C) (01/17 0531) Pulse Rate:  [62-93] 93 (01/17 0531) Resp:  [8-20] 20 (01/17 0800) BP: (99-149)/(63-84) 99/63 mmHg (01/17 0531) SpO2:  [90 %-100 %] 92 % (01/17 0531)  Intake/Output from previous day: 01/16 0701 - 01/17 0700 In: 3790 [P.O.:840; I.V.:2950] Out: 2475 [Urine:2075; Blood:400] Intake/Output this shift: Total I/O In: 360 [P.O.:360] Out: -    Recent Labs  09/24/13 1502 09/25/13 0355  HGB 13.0 10.8*    Recent Labs  09/24/13 1502 09/25/13 0355  WBC 12.7* 9.8  RBC 4.02* 3.31*  HCT 38.0* 31.3*  PLT 246 232    Recent Labs  09/24/13 1502 09/25/13 0355  NA  --  140  K  --  4.5  CL  --  103  CO2  --  26  BUN  --  15  CREATININE 0.94 0.91  GLUCOSE  --  123*  CALCIUM  --  8.5   No results found for this basename: LABPT, INR,  in the last 72 hours  Neurologically intact ABD soft Neurovascular intact Sensation intact distally Intact pulses distally Dorsiflexion/Plantar flexion intact Incision: scant drainage No cellulitis present Compartment soft  Assessment/Plan: 1 Day Post-Op Procedure(s) (LRB): RIGHT TOTAL HIP ARTHROPLASTY (Right) Advance diet Up with therapy D/C IV fluids Plan for discharge tomorrow Discharge home with home health Change dressing tomorrow  Gearldine ShownNTON, M. LINDSEY 09/25/2013, 11:04 AM

## 2013-09-26 LAB — CBC WITH DIFFERENTIAL/PLATELET
BASOS PCT: 0 % (ref 0–1)
Basophils Absolute: 0 10*3/uL (ref 0.0–0.1)
Eosinophils Absolute: 0.1 10*3/uL (ref 0.0–0.7)
Eosinophils Relative: 1 % (ref 0–5)
HCT: 25 % — ABNORMAL LOW (ref 39.0–52.0)
HEMOGLOBIN: 8.7 g/dL — AB (ref 13.0–17.0)
LYMPHS PCT: 29 % (ref 12–46)
Lymphs Abs: 3 10*3/uL (ref 0.7–4.0)
MCH: 32.3 pg (ref 26.0–34.0)
MCHC: 34.8 g/dL (ref 30.0–36.0)
MCV: 92.9 fL (ref 78.0–100.0)
MONO ABS: 1 10*3/uL (ref 0.1–1.0)
MONOS PCT: 9 % (ref 3–12)
Neutro Abs: 6.4 10*3/uL (ref 1.7–7.7)
Neutrophils Relative %: 61 % (ref 43–77)
Platelets: 188 10*3/uL (ref 150–400)
RBC: 2.69 MIL/uL — ABNORMAL LOW (ref 4.22–5.81)
RDW: 13.8 % (ref 11.5–15.5)
WBC: 10.5 10*3/uL (ref 4.0–10.5)

## 2013-09-26 NOTE — Progress Notes (Signed)
Physical Therapy Treatment Note  Pain limiting activity tolerance this afternoon -- pt only agreeable to therex; Lengthy discussion re: the need to participate in PT/OT, and moblize, and progressively ambulate to be able to meet his goal of getting home; Pt states he'll get up and walk and practice steps tomorrow  Pain 10+/10 R hip; was premedicated for pain; patient repositioned for comfort   09/26/13 1600  PT Visit Information  Last PT Received On 09/26/13  Assistance Needed +1  History of Present Illness Pt is a 48 y/o male admitted with THA posterior approach.   PT Time Calculation  PT Start Time 1413  PT Stop Time 1433  PT Time Calculation (min) 20 min  Subjective Data  Subjective is declining progressive amb due to pain  Patient Stated Goal To return home to his infant son  Precautions  Precautions Fall;Posterior Hip  Precaution Comments Discussed with pt and wife in detail Patient able to recall 3/3 precautions, however requires cues to maintain precautions with functional mobility.   Restrictions  RLE Weight Bearing WBAT  Cognition  Arousal/Alertness Awake/alert  Behavior During Therapy WFL for tasks assessed/performed  Overall Cognitive Status Within Functional Limits for tasks assessed  Exercises  Exercises General Lower Extremity  General Exercises - Lower Extremity  Ankle Circles/Pumps AROM;Right;10 reps;Supine  Quad Sets AROM;Right;20 reps;Supine  Gluteal Sets AROM;Both;10 reps;Supine  Heel Slides AAROM;Right;10 reps;Supine  Hip ABduction/ADduction AROM;Right;10 reps;Supine (Isometric hip abductor muscle activation)  PT - End of Session  Activity Tolerance Patient limited by pain (Refused all OOB activity)  Patient left in bed;with call bell/phone within reach  Nurse Communication Mobility status  PT - Assessment/Plan  PT Plan Current plan remains appropriate  PT Frequency 7X/week  Follow Up Recommendations Home health PT  PT equipment Rolling walker with 5"  wheels;3in1 (PT)  PT Goal Progression  Progress towards PT goals Progressing toward goals  Acute Rehab PT Goals  PT Goal Formulation With patient/family  Time For Goal Achievement 10/09/13  Potential to Achieve Goals Good  PT General Charges  $$ ACUTE PT VISIT 1 Procedure  PT Treatments  $Therapeutic Exercise 8-22 mins   DodgevilleHolly Leamon [place], South CarolinaPT [phone]

## 2013-09-26 NOTE — Progress Notes (Signed)
Occupational Therapy Treatment Patient Details Name: Michael BureauRobert L [name] MRN: [id number] DOB: 08/08/1966 Today's Date: 09/26/2013 Time: [id number] OT Time Calculation (min): 26 min  OT Assessment / Plan / Recommendation  History of present illness Pt is a 48 y/o male admitted with THA posterior approach.    OT comments  Pt limited by pain today. Practiced with AE for LB dressing. Educated on tub transfer techniques.   Follow Up Recommendations  Home health OT;Supervision - Intermittent    Barriers to Discharge       Equipment Recommendations  3 in 1 bedside comode;Other (comment) (AE-long handled sponge, long shoehorn, reacher, sockaid)    Recommendations for Other Services    Frequency Min 2X/week   Progress towards OT Goals Progress towards OT goals: Progressing toward goals  Plan Discharge plan remains appropriate    Precautions / Restrictions Precautions Precautions: Fall;Posterior Hip Precaution Booklet Issued: No Precaution Comments: Pt able to state 3/3 precautions. Restrictions Weight Bearing Restrictions: Yes RLE Weight Bearing: Weight bearing as tolerated   Pertinent Vitals/Pain Pain 10/10 in RLE hip/buttocks area. Repositioned. Increased activity during session.     ADL  Lower Body Dressing: Moderate assistance Where Assessed - Lower Body Dressing: Supported sit to stand Toilet Transfer: Hydrographic surveyorMin guard Toilet Transfer Method: Sit to Baristastand Toilet Transfer Equipment: Bedside commode Equipment Used: Gait belt;Rolling walker;Reacher;Long-handled sponge;Long-handled shoe horn;Sock aid Transfers/Ambulation Related to ADLs: Min guard  ADL Comments: Educated on tub transfer techniques. Educated on dressing technique and recommended sitting on chair/bed for dressing and to stand in front of chair/bed with walker in front when pulling up LB clothing. Discussed use of bag on walker to carry items.  Reviewed AE for LB ADLs.     OT Diagnosis:    OT Problem List:   OT Treatment  Interventions:     OT Goals(current goals can now be found in the care plan section) Acute Rehab OT Goals Patient Stated Goal: not stated OT Goal Formulation: With patient Time For Goal Achievement: 10/02/13 Potential to Achieve Goals: Good ADL Goals Pt Will Perform Lower Body Dressing: with modified independence;with adaptive equipment;sit to/from stand Pt Will Transfer to Toilet: with modified independence;ambulating (3 in 1 over commode) Pt Will Perform Toileting - Clothing Manipulation and hygiene: with modified independence;sit to/from stand Pt Will Perform Tub/Shower Transfer: Tub transfer;with supervision;rolling walker;ambulating (tub equipment tbd) Additional ADL Goal #1: Pt will independently verbalize and demonstrate 3/3 hip precautions.    Visit Information  Last OT Received On: 09/26/13 Assistance Needed: +1 History of Present Illness: Pt is a 48 y/o male admitted with THA posterior approach.     Subjective Data      Prior Functioning       Cognition  Cognition Arousal/Alertness: Awake/alert Behavior During Therapy: WFL for tasks assessed/performed Overall Cognitive Status: Within Functional Limits for tasks assessed    Mobility  Bed Mobility Overal bed mobility: Needs Assistance Bed Mobility: Supine to Sit;Sit to Supine Supine to sit: Min assist Sit to supine: Min assist General bed mobility comments: Assist for RLE. Educated on use of blanket/sheet to help move LE and pt tried to use it. Transfers Overall transfer level: Needs assistance Equipment used: Rolling walker (2 wheeled) Transfers: Sit to/from Stand Sit to Stand: Min guard General transfer comment: cues for technique and positioning of RLE.          End of Session OT - End of Session Equipment Utilized During Treatment: Gait belt;Rolling walker Activity Tolerance: Patient limited by pain Patient left: in bed;with  call bell/phone within reach;with bed alarm set  GO     Earlie Raveling  OTR/L [phone] 09/26/2013, 5:04 PM

## 2013-09-26 NOTE — Plan of Care (Signed)
Problem: Acute Rehab PT Goals(only PT should resolve) Goal: Pt Will Ambulate Outcome: Not Progressing Limited by pain

## 2013-09-26 NOTE — Progress Notes (Signed)
Subjective: 2 Days Post-Op Procedure(s) (LRB): RIGHT TOTAL HIP ARTHROPLASTY (Right) Patient reports pain as 8 on 0-10 scale.  Patient appears to be in a fair amount of pain and was unable to ambulate with PT this morning.  States that he can hardly lift his right leg.  Patient denies any nausea/vomiting, fever/chills, lightheadedness/dizziness or shortness of breath.  Positive flatus, but no BM.    Objective: Vital signs in last 24 hours: Temp:  [98.2 F (36.8 C)-99.5 F (37.5 C)] 98.2 F (36.8 C) (01/18 0412) Pulse Rate:  [97-105] 102 (01/18 0412) Resp:  [18-20] 18 (01/18 0412) BP: (131-141)/(68-80) 136/70 mmHg (01/18 0412) SpO2:  [95 %-99 %] 96 % (01/18 0412)  Intake/Output from previous day: 01/17 0701 - 01/18 0700 In: 1800 [P.O.:1800] Out: 2000 [Urine:2000] Intake/Output this shift: Total I/O In: 360 [P.O.:360] Out: -    Recent Labs  09/24/13 1502 09/25/13 0355  HGB 13.0 10.8*    Recent Labs  09/24/13 1502 09/25/13 0355  WBC 12.7* 9.8  RBC 4.02* 3.31*  HCT 38.0* 31.3*  PLT 246 232    Recent Labs  09/24/13 1502 09/25/13 0355  NA  --  140  K  --  4.5  CL  --  103  CO2  --  26  BUN  --  15  CREATININE 0.94 0.91  GLUCOSE  --  123*  CALCIUM  --  8.5   No results found for this basename: LABPT, INR,  in the last 72 hours  Neurologically intact ABD soft Neurovascular intact Sensation intact distally Intact pulses distally Dorsiflexion/Plantar flexion intact Incision: scant drainage No cellulitis present Compartment soft  Assessment/Plan: 2 Days Post-Op Procedure(s) (LRB): RIGHT TOTAL HIP ARTHROPLASTY (Right) Advance diet Up with therapy Discharge home with home health Probably hold of D/C today due to inability to ambulate Will hopefully D/C tomorrow Change dressing today  ANTON, M. LINDSEY 09/26/2013, 10:30 AM

## 2013-09-27 LAB — CBC
HEMATOCRIT: 23.2 % — AB (ref 39.0–52.0)
HEMOGLOBIN: 8.1 g/dL — AB (ref 13.0–17.0)
MCH: 32.5 pg (ref 26.0–34.0)
MCHC: 34.9 g/dL (ref 30.0–36.0)
MCV: 93.2 fL (ref 78.0–100.0)
Platelets: 186 10*3/uL (ref 150–400)
RBC: 2.49 MIL/uL — ABNORMAL LOW (ref 4.22–5.81)
RDW: 13.5 % (ref 11.5–15.5)
WBC: 9.7 10*3/uL (ref 4.0–10.5)

## 2013-09-27 MED ORDER — DSS 100 MG PO CAPS: 100.0000 mg | ORAL_CAPSULE | Freq: Two times a day (BID) | ORAL | Status: DC

## 2013-09-27 NOTE — Care Management Note (Signed)
09/27/13 11:45 AM Vance PeperSusan Treyvin Glidden RN BSN Case Manager Per PT/OT eval [facility] is aware.

## 2013-09-27 NOTE — Progress Notes (Signed)
Pt refuses to turn or be turned or get oob,uses urinal.States is doing bed exercises but is too sore from doing PT on Saturday.Asking for Dilaudid IV q 2hrs.Enc pt to use po Dilaudid instead.also enc to move more cautioned about medical  problems which could arise from not moving enough. Linward HeadlandBeverly, Michael [name]

## 2013-09-27 NOTE — Discharge Summary (Signed)
PATIENT ID: Michael [name]        MRN:  [id number]          DOB/AGE: 05/12/1966 / 48 y.o.    DISCHARGE SUMMARY  ADMISSION DATE:    09/24/2013 DISCHARGE DATE:   09/27/2013   ADMISSION DIAGNOSIS: OA RIGHT HIP    DISCHARGE DIAGNOSIS:  OA RIGHT HIP    ADDITIONAL DIAGNOSIS: Active Problems:   Avascular necrosis of bone of right hip  Past Medical History  Diagnosis Date  . HIV disease   . Cocaine abuse   . Marijuana abuse   . ETOH abuse     PROCEDURE: Procedure(s): RIGHT TOTAL HIP ARTHROPLASTY Right on 09/24/2013  CONSULTS: PT/OT/SW      HISTORY:  See H&P in chart  HOSPITAL COURSE:  Michael [name] is a 48 y.o. admitted on 09/24/2013 and found to have a diagnosis of OA RIGHT HIP.  After appropriate laboratory studies were obtained  they were taken to the operating room on 09/24/2013 and underwent  Procedure(s): RIGHT TOTAL HIP ARTHROPLASTY  Right.   They were given perioperative antibiotics:  Anti-infectives   Start     Dose/Rate Route Frequency Ordered Stop   09/26/13 0800  emtricitabine-tenofovir (TRUVADA) 200-300 MG per tablet 1 tablet     1 tablet Oral Daily with breakfast 09/25/13 1036     09/26/13 0800  rilpivirine (EDURANT) tablet 25 mg     25 mg Oral Daily with breakfast 09/25/13 1036     09/24/13 1500  emtricitabine-tenofovir (TRUVADA) 200-300 MG per tablet 1 tablet  Status:  Discontinued     1 tablet Oral Daily 09/24/13 1358 09/25/13 1036   09/24/13 1500  rilpivirine (EDURANT) tablet 25 mg  Status:  Discontinued     25 mg Oral Daily with breakfast 09/24/13 1358 09/25/13 1036   09/24/13 1400  Emtricitab-Rilpivir-Tenofovir 200-25-300 MG TABS 1 tablet  Status:  Discontinued     1 tablet Oral Daily 09/24/13 1348 09/24/13 1358   09/24/13 1400  ceFAZolin (ANCEF) IVPB 1 g/50 mL premix     1 g 100 mL/hr over 30 Minutes Intravenous Every 6 hours 09/24/13 1348 09/24/13 2125   09/24/13 0600  ceFAZolin (ANCEF) IVPB 2 g/50 mL premix     2 g 100 mL/hr over 30 Minutes  Intravenous On call to O.R. 09/23/13 1357 09/24/13 0725    .  Tolerated the procedure well.  Ofirmev given intraop.  Toradol was given post op.  POD #1, allowed out of bed to a chair.  PT for ambulation and exercise program.   IV [name] locked.  O2 discontionued.  POD #2, continued PT and ambulation.  Refusing PT due to increased pain.  POD#3, weaning off pain iv pain meds, c/o increased pain, PT/OT feels mobility has declined and have concerns for his safety with d/c home recommending SNF.  SW consulted for placement.  Patient refusing d/c anywhere but home is without transportation plan for d/c home on POD#4.    The remainder of the hospital course was dedicated to ambulation and strengthening.   The patient was discharged on 4 days post op in  Stable condition.  Blood products given:none  DIAGNOSTIC STUDIES: Recent vital signs: Patient Vitals for the past 24 hrs:  BP Temp Temp src Pulse Resp SpO2  09/27/13 1332 124/66 mmHg 98.3 F (36.8 C) Oral 105 18 98 %  09/27/13 0800 - - - - 18 -  09/27/13 0552 135/69 mmHg 99 F (37.2 C) Oral 105 16  98 %  09/26/13 2048 144/76 mmHg 98 F (36.7 C) Axillary 102 16 96 %  09/26/13 1600 - - - - 14 95 %  09/26/13 1404 118/78 mmHg 100 F (37.8 C) - 95 18 95 %       Recent laboratory studies:  Recent Labs  09/24/13 1502 09/25/13 0355 09/26/13 1603 09/27/13 0435  WBC 12.7* 9.8 10.5 9.7  HGB 13.0 10.8* 8.7* 8.1*  HCT 38.0* 31.3* 25.0* 23.2*  PLT 246 232 188 186    Recent Labs  09/24/13 1502 09/25/13 0355  NA  --  140  K  --  4.5  CL  --  103  CO2  --  26  BUN  --  15  CREATININE 0.94 0.91  GLUCOSE  --  123*  CALCIUM  --  8.5   Lab Results  Component Value Date   INR 1.01 09/15/2013     Recent Radiographic Studies :  Dg Chest 2 View  09/15/2013   CLINICAL DATA:  Right total hip arthroplasty.  EXAM: CHEST  2 VIEW  COMPARISON:  DG CHEST 2 VIEW dated 05/29/2012  FINDINGS: The heart size and mediastinal contours are within normal  limits. Both lungs are clear. The visualized skeletal structures are unremarkable.  IMPRESSION: No active cardiopulmonary disease.   Electronically Signed   By: Elige Ko   On: 09/15/2013 12:27   Dg Pelvis Portable  09/24/2013   CLINICAL DATA:  Post right total hip replacement  EXAM: PORTABLE PELVIS 1-2 VIEWS  COMPARISON:  None  FINDINGS: Post right total hip replacement. No definite evidence of hardware failure or loosening, though note, the caudal aspect of the right femoral stem component is excluded from view. Alignment appears near anatomic. No definite fracture on this solitary AP projection radiograph. There is expected scattered foci of subcutaneous emphysema about the operative site. Multiple skin staples overlie the upper outer aspect of the right thigh. No definite radiopaque foreign body.  IMPRESSION: Post right total hip replacement without definite evidence of complication.   Electronically Signed   By: Simonne Come M.D.   On: 09/24/2013 14:41   Dg Hip Portable 1 View Right  09/24/2013   CLINICAL DATA:  Postoperative radiograph. Status post right total hip arthroplasty.  EXAM: PORTABLE RIGHT HIP - 1 VIEW  COMPARISON:  07/08/2008.  FINDINGS: Uncomplicated new right total hip arthroplasty is present. The prosthetic femoral head is located. Expected postsurgical changes in the soft tissues. Soft tissue emphysema tracks along the proximal bilaterally, more than typically seen suggesting an extensive dissection.  IMPRESSION: Uncomplicated right total hip arthroplasty.   Electronically Signed   By: Andreas Newport M.D.   On: 09/24/2013 13:20    DISCHARGE INSTRUCTIONS:  Future Appointments Provider Department Dept Phone   10/26/2013 10:15 AM Judyann Munson, MD [facility] [phone]      DISCHARGE MEDICATIONS:     Medication List         Emtricitab-Rilpivir-Tenofovir 200-25-300 MG Tabs  Commonly known as:  COMPLERA  Take 1 tablet by mouth daily.  TAKE 1 TABLET BY MOUTH WITH A 400-600 CALORIE MEAL     enoxaparin 30 MG/0.3ML injection  Commonly known as:  LOVENOX  Inject 0.3 mLs (30 mg total) into the skin every 12 (twelve) hours.     HYDROmorphone 2 MG tablet  Commonly known as:  DILAUDID  1-2 tabs po q4-6hrs prn pain     methocarbamol 750 MG tablet  Commonly known as:  ROBAXIN-750  Take 1 tablet (750 mg total) by mouth 4 (four) times daily.        FOLLOW UP VISIT:       Follow-up Information   Follow up with CAFFREY JR,W D, MD. Schedule an appointment as soon as possible for a visit in 2 weeks.   Specialty:  Orthopedic Surgery   Contact information:   [address] [place] [address] [phone]       DISPOSITION:   Home with HHPT  CONDITION:  Stable   Margart Sickles 09/27/2013, 2:02 PM

## 2013-09-27 NOTE — Progress Notes (Signed)
Physical Therapy Treatment Patient Details Name: Michael BureauRobert L Mclouth MRN: [id number] DOB: 1966-06-04 Today's Date: 09/27/2013 Time: 1019-1050 PT Time Calculation (min): 31 min  PT Assessment / Plan / Recommendation  History of Present Illness Pt is a 48 y/o male admitted with THA posterior approach.    PT Comments   Pt progressing very slowly towards physical therapy goals. +2 assist was required for safety this session, and co-treat was required as pt actively refusing therapy services with exception of having IV pain meds. Pt showing a marked decline in gait distance and technique, and is possibly due to pain control and refusal of progressive ambulation POD #2. Discussed at length with pt regarding benefits of participating with PT, and performing therapeutic exercise, however pt insists that he "needs to heal" before he will be able to walk. At this time, pt is unsafe for d/c home and would greatly benefit from continued skilled PT at a SNF prior to returning home.    Follow Up Recommendations  SNF     Does the patient have the potential to tolerate intense rehabilitation     Barriers to Discharge        Equipment Recommendations  Rolling walker with 5" wheels;3in1 (PT)    Recommendations for Other Services    Frequency 7X/week   Progress towards PT Goals Progress towards PT goals: Progressing toward goals  Plan Discharge plan needs to be updated    Precautions / Restrictions Precautions Precautions: Fall;Posterior Hip Precaution Comments: Pt able to state 3/3 precautions. Restrictions Weight Bearing Restrictions: Yes RLE Weight Bearing: Weight bearing as tolerated   Pertinent Vitals/Pain 10/10 at rest and throughout session.     Mobility  Bed Mobility Overal bed mobility: Needs Assistance Bed Mobility: Supine to Sit Supine to sit: Min assist General bed mobility comments: Assist for movement and support of RLE.  Transfers Overall transfer level: Needs  assistance Equipment used: Rolling walker (2 wheeled) Transfers: Sit to/from Stand Sit to Stand: Mod assist;+2 physical assistance General transfer comment: VC's for sequencing and technique. Ambulation/Gait Ambulation/Gait assistance: Min assist Ambulation Distance (Feet): 20 Feet Assistive device: Rolling walker (2 wheeled) Gait Pattern/deviations: Step-to pattern;Decreased dorsiflexion - left;Decreased stride length;Decreased dorsiflexion - right;Decreased weight shift to right;Narrow base of support Gait velocity: Decreased Gait velocity interpretation: Below normal speed for age/gender General Gait Details: VC's for sequencing and technique. Assist to advance RLE forward. VERY slow compared to POD #1. Stairs: Yes Stairs assistance: +2 physical assistance Stair Management: Forwards;With walker Number of Stairs: 1 General stair comments: Pt has one small step to enter home. Was able to perform with min assist +2 for safety and support.     Exercises     PT Diagnosis:    PT Problem List:   PT Treatment Interventions:     PT Goals (current goals can now be found in the care plan section) Acute Rehab PT Goals Patient Stated Goal: To stop hurting and heal PT Goal Formulation: With patient/family Time For Goal Achievement: 10/09/13 Potential to Achieve Goals: Good  Visit Information  Last PT Received On: 09/27/13 Assistance Needed: +2 (Safety) PT/OT/SLP Co-Evaluation/Treatment: Yes Reason for Co-Treatment: Other (comment) (Pt refusing therapy services, finally agreed) PT goals addressed during session: Mobility/safety with mobility;Balance;Proper use of DME;Strengthening/ROM History of Present Illness: Pt is a 48 y/o male admitted with THA posterior approach.     Subjective Data  Subjective: Pt refusing therapy this morning, agreed to participate after IV pain meds were given by RN as a ONE TIME  dose.  Patient Stated Goal: To stop hurting and heal   Cognition   Cognition Arousal/Alertness: Awake/alert Behavior During Therapy: Agitated Overall Cognitive Status: Within Functional Limits for tasks assessed    Balance  Balance Overall balance assessment: No apparent balance deficits (not formally assessed)  End of Session PT - End of Session Equipment Utilized During Treatment: Gait belt Activity Tolerance: Patient limited by pain Patient left: in chair;with call bell/phone within reach Nurse Communication: Mobility status;Patient requests pain meds   GP     Ruthann Cancer 09/27/2013, 11:58 AM  Ruthann Cancer, PT, DPT [phone]

## 2013-09-27 NOTE — Progress Notes (Addendum)
Subjective: 3 Days Post-Op Procedure(s) (LRB): RIGHT TOTAL HIP ARTHROPLASTY (Right) Patient reports pain as moderate and severe.  Patient reports pain is too severe to get up with PT today.  He reports he did walk in the hall over the weekend but stopping his IV pain med today he is unable to participate with PT.    Objective: Vital signs in last 24 hours: Temp:  [98 F (36.7 C)-100 F (37.8 C)] 99 F (37.2 C) (01/19 0552) Pulse Rate:  [95-105] 105 (01/19 0552) Resp:  [14-18] 18 (01/19 0800) BP: (118-144)/(69-78) 135/69 mmHg (01/19 0552) SpO2:  [95 %-98 %] 98 % (01/19 0552)  Intake/Output from previous day: 01/18 0701 - 01/19 0700 In: 2370 [P.O.:1800; I.V.:570] Out: 3225 [Urine:3225] Intake/Output this shift: Total I/O In: 240 [P.O.:240] Out: -    Recent Labs  09/24/13 1502 09/25/13 0355 09/26/13 1603 09/27/13 0435  HGB 13.0 10.8* 8.7* 8.1*    Recent Labs  09/26/13 1603 09/27/13 0435  WBC 10.5 9.7  RBC 2.69* 2.49*  HCT 25.0* 23.2*  PLT 188 186    Recent Labs  09/24/13 1502 09/25/13 0355  NA  --  140  K  --  4.5  CL  --  103  CO2  --  26  BUN  --  15  CREATININE 0.94 0.91  GLUCOSE  --  123*  CALCIUM  --  8.5   No results found for this basename: LABPT, INR,  in the last 72 hours  Neurovascular intact Sensation intact distally Intact pulses distally Dorsiflexion/Plantar flexion intact Incision: dressing C/D/I Compartment soft  Assessment/Plan: 3 Days Post-Op Procedure(s) (LRB): RIGHT TOTAL HIP ARTHROPLASTY (Right) Up with therapy Discharge home with home health Long discussion with pt today regarding pain control after d/c.  He has likely developed a tolerance to the pain medication due to his polysubstance abuse, but explained he cannot be d/c home on IV pain med.  Ok for one time iv dose of dilaudid 1mg  prior to PT this afternoon, otherwise oral regimen.  Plan on d/c home with HHPT later today, but would like him to get in another PT session.      Margart SicklesChadwell, Drucilla Cumber 09/27/2013, 10:45 AM   After discussion with PT/OT and patient feels has declined some as far as his mobility following surgery there are concerns for safety with d/c home now recommending SNF placement, will consult SW.  Patient may d/c when bed available.

## 2013-09-27 NOTE — Progress Notes (Signed)
Physical Therapy Treatment Patient Details Name: Michael BureauRobert L [name] MRN: [id number] DOB: 1965-10-03 Today's Date: 09/27/2013 Time: 4098-11911511-1524 PT Time Calculation (min): 13 min  PT Assessment / Plan / Recommendation  History of Present Illness Pt is a 48 y/o male admitted with THA posterior approach.    PT Comments   Pt not progressing towards physical therapy goals at this time. Pt continues to be limited by pain, and only ambulated 5 feet during session. Pt refused all other functional mobility, therapeutic exercise, and sitting in recliner. Continue to recommend SNF however pt refusing STR at this time.   Follow Up Recommendations  SNF     Does the patient have the potential to tolerate intense rehabilitation     Barriers to Discharge        Equipment Recommendations  Rolling walker with 5" wheels;3in1 (PT)    Recommendations for Other Services    Frequency 7X/week   Progress towards PT Goals Progress towards PT goals: Not progressing toward goals - comment  Plan Current plan remains appropriate    Precautions / Restrictions Precautions Precautions: Fall;Posterior Hip Precaution Booklet Issued: No Precaution Comments: Pt able to state 3/3 precautions. Restrictions Weight Bearing Restrictions: Yes RLE Weight Bearing: Weight bearing as tolerated   Pertinent Vitals/Pain 10/10 at rest and during session.     Mobility  Bed Mobility Overal bed mobility: Needs Assistance Bed Mobility: Supine to Sit;Sit to Supine Supine to sit: Min assist Sit to supine: Mod assist General bed mobility comments: Assist for movement and support of RLE. Pt  Transfers Overall transfer level: Needs assistance Equipment used: Rolling walker (2 wheeled) Transfers: Sit to/from Stand Sit to Stand: Min guard General transfer comment: VC's for sequencing and technique. Ambulation/Gait Ambulation/Gait assistance: Min assist Ambulation Distance (Feet): 5 Feet Assistive device: Rolling walker (2  wheeled) Gait Pattern/deviations: Step-to pattern;Decreased stride length;Narrow base of support Gait velocity: Decreased Gait velocity interpretation: Below normal speed for age/gender General Gait Details: Pt only able to tolerate ambulating 5 feet. Pt does not make corrective changes with cueing and drags LE's behind him, advancing LE's by pushing self up on UE's and contracting abs to bring feet forward.     Exercises Other Exercises Other Exercises: Pt adamantly refuses therapeutic exercise at this time, stating he cannot tolerate it due to pain.    PT Diagnosis:    PT Problem List:   PT Treatment Interventions:     PT Goals (current goals can now be found in the care plan section) Acute Rehab PT Goals Patient Stated Goal: To stop hurting and heal PT Goal Formulation: With patient/family Time For Goal Achievement: 10/09/13 Potential to Achieve Goals: Good  Visit Information  Last PT Received On: 09/27/13 Assistance Needed: +1 Reason for Co-Treatment: For patient/therapist safety OT goals addressed during session: ADL's and self-care History of Present Illness: Pt is a 48 y/o male admitted with THA posterior approach.     Subjective Data  Subjective: Pt refusing therapy this afternoon, agreed to participate after second attempt this pm. Pt got very agitated and stated he wanted to "get this over with". Patient Stated Goal: To stop hurting and heal   Cognition  Cognition Arousal/Alertness: Awake/alert Behavior During Therapy: Agitated Overall Cognitive Status: Within Functional Limits for tasks assessed    Balance  Balance Overall balance assessment: No apparent balance deficits (not formally assessed) High Level Balance Comments: standing at sink for grooming, requires min guard A  End of Session PT - End of Session Equipment Utilized During  Treatment: Gait belt Activity Tolerance: Patient limited by pain Patient left: with call bell/phone within reach;in bed Nurse  Communication: Mobility status   GP     Ruthann Cancer 09/27/2013, 3:56 PM  Ruthann Cancer, PT, DPT [phone]

## 2013-09-27 NOTE — Progress Notes (Signed)
Occupational Therapy Treatment Patient Details Name: Michael [name] MRN: [id number] DOB: 03/25/1966 Today's Date: 09/27/2013 Time: 4098-1191 OT Time Calculation (min): 35 min  OT Assessment / Plan / Recommendation  History of present illness Pt is a 48 y/o male admitted with THA posterior approach.    OT comments  Pt making slow progress towards functional goals. Discussed further therapy after acute [facility] d/c and pt agreeable that he would need more aggressive therapy and states that he does not feel safe returning home with just assist from his mother and ex wife  Follow Up Recommendations  SNF;Supervision/Assistance - 24 hour    Barriers to Discharge   Uncertain if pt's mother and ex wife will be able to safely provide current level of care    Equipment Recommendations   TBD, tub bench/shower chair   Recommendations for Other Services    Frequency Min 2X/week   Progress towards OT Goals Progress towards OT goals: Progressing toward goals  Plan Discharge plan needs to be updated    Precautions / Restrictions Precautions Precautions: Fall;Posterior Hip Precaution Comments: Pt able to state 3/3 precautions. Restrictions Weight Bearing Restrictions: Yes RLE Weight Bearing: Weight bearing as tolerated   Pertinent Vitals/Pain 10/10 before activity and pain meds, 6/10 after pain meds, 10/10 after activity    ADL  Grooming: Performed;Minimal assistance;Min guard Where Assessed - Grooming: Supported standing Lower Body Dressing: Performed;Moderate assistance Toilet Transfer: Hydrographic surveyor Method: Sit to Barista: Grab bars;Raised toilet seat with arms (or 3-in-1 over toilet) Toileting - Clothing Manipulation and Hygiene: Performed;Min guard;Minimal assistance Where Assessed - Engineer, mining and Hygiene: Standing Equipment Used: Gait belt;Rolling walker;Reacher;Long-handled sponge;Long-handled shoe horn;Sock  aid Transfers/Ambulation Related to ADLs: pt refused tub bench transfer agreeable to steps with PT ADL Comments: reviewed ADL A/E with pt. Pt able to verbalize 3/3 hip precautions    OT Diagnosis:    OT Problem List:   OT Treatment Interventions:     OT Goals(current goals can now be found in the care plan section) Acute Rehab OT Goals Patient Stated Goal: To stop hurting and heal  Visit Information  Last OT Received On: 09/27/13 Assistance Needed: +2 PT/OT/SLP Co-Evaluation/Treatment: Yes Reason for Co-Treatment: For patient/therapist safety PT goals addressed during session: Mobility/safety with mobility;Balance;Proper use of DME;Strengthening/ROM OT goals addressed during session: ADL's and self-care History of Present Illness: Pt is a 48 y/o male admitted with THA posterior approach.     Subjective Data      Prior Functioning       Cognition  Cognition Arousal/Alertness: Awake/alert Behavior During Therapy: Agitated Overall Cognitive Status: Within Functional Limits for tasks assessed    Mobility  Bed Mobility Overal bed mobility: Needs Assistance Bed Mobility: Supine to Sit Supine to sit: Min assist Sit to supine: Min assist General bed mobility comments: Assist for movement and support of RLE.  Transfers Overall transfer level: Needs assistance Equipment used: Rolling walker (2 wheeled) Transfers: Sit to/from Stand Sit to Stand: Mod assist;+2 physical assistance General transfer comment: VC's for sequencing and technique.    Exercises      Balance Balance Overall balance assessment: Needs assistance High Level Balance Comments: standing at sink for grooming, requires min guard A  End of Session OT - End of Session Equipment Utilized During Treatment: Gait belt;Rolling walker;Other (comment) (3 in 1 over toilet, ADL A/E) Activity Tolerance: Patient limited by pain Patient left: with call bell/phone within reach;in chair  GO     Karleen Hampshire,  Ronie SpiesDenise  Jeanette 09/27/2013, 12:48 PM

## 2013-09-28 ENCOUNTER — Encounter (HOSPITAL_COMMUNITY): Payer: Self-pay | Admitting: General Practice

## 2013-09-28 LAB — CBC WITH DIFFERENTIAL/PLATELET
Basophils Absolute: 0 10*3/uL (ref 0.0–0.1)
Basophils Relative: 0 % (ref 0–1)
EOS ABS: 0.1 10*3/uL (ref 0.0–0.7)
Eosinophils Relative: 1 % (ref 0–5)
HCT: 22 % — ABNORMAL LOW (ref 39.0–52.0)
Hemoglobin: 7.9 g/dL — ABNORMAL LOW (ref 13.0–17.0)
Lymphocytes Relative: 20 % (ref 12–46)
Lymphs Abs: 1.7 10*3/uL (ref 0.7–4.0)
MCH: 32.8 pg (ref 26.0–34.0)
MCHC: 35.9 g/dL (ref 30.0–36.0)
MCV: 91.3 fL (ref 78.0–100.0)
MONOS PCT: 10 % (ref 3–12)
Monocytes Absolute: 0.8 10*3/uL (ref 0.1–1.0)
NEUTROS ABS: 5.9 10*3/uL (ref 1.7–7.7)
NEUTROS PCT: 69 % (ref 43–77)
PLATELETS: 199 10*3/uL (ref 150–400)
RBC: 2.41 MIL/uL — AB (ref 4.22–5.81)
RDW: 13.3 % (ref 11.5–15.5)
WBC: 8.5 10*3/uL (ref 4.0–10.5)

## 2013-09-28 NOTE — Progress Notes (Signed)
Physical Therapy Treatment Patient Details Name: Michael BureauRobert L Festa MRN: [id number] DOB: 29-Dec-1965 Today's Date: 09/28/2013 Time: [id number] PT Time Calculation (min): 26 min  PT Assessment / Plan / Recommendation  History of Present Illness Pt is a 48 y/o male admitted with THA posterior approach.    PT Comments   Pt not progressing towards physical therapy goals and is refusing progressive ambulation attempts. Pt able to negotiate the small step backwards with RW well, however had 2 LOB during ambulation in which mod assist was provided to prevent a fall. Spoke at length with pt regarding safety at home and using a gait belt when entering home with family. PT continues to recommend SNF at d/c, however pt is refusing and wants to return home.  Follow Up Recommendations  SNF     Does the patient have the potential to tolerate intense rehabilitation     Barriers to Discharge        Equipment Recommendations  Rolling walker with 5" wheels;3in1 (PT)    Recommendations for Other Services    Frequency 7X/week   Progress towards PT Goals Progress towards PT goals: Not progressing toward goals - comment  Plan Current plan remains appropriate    Precautions / Restrictions Precautions Precautions: Fall;Posterior Hip Precaution Booklet Issued: No Precaution Comments: Pt able to state 3/3 precautions. Restrictions Weight Bearing Restrictions: Yes RLE Weight Bearing: Weight bearing as tolerated   Pertinent Vitals/Pain 10/10 pain reported before, during, after session.     Mobility  Bed Mobility Overal bed mobility: Needs Assistance Bed Mobility: Supine to Sit;Sit to Supine Supine to sit: Min assist Sit to supine: Mod assist General bed mobility comments: Assist for movement and support of RLE. Pt having difficulty sitting EOB as he does not want to put weight through the RLE.  Transfers Overall transfer level: Needs assistance Equipment used: Rolling walker (2 wheeled) Transfers:  Sit to/from Stand Sit to Stand: Min guard General transfer comment: VC's for sequencing and technique. Ambulation/Gait Ambulation/Gait assistance: Min guard;Mod assist Ambulation Distance (Feet): 15 Feet Assistive device: Rolling walker (2 wheeled) Gait Pattern/deviations: Step-to pattern;Decreased stride length;Narrow base of support;Decreased weight shift to right Gait velocity: Decreased Gait velocity interpretation: Below normal speed for age/gender General Gait Details: Pt continues to use UE's to lift body up to advance LE's rather than bear weight on either side to step forward. 2 LOB episodes during turns in which pt required mod assist to prevent a fall.  Stairs: Yes Stairs assistance: Min guard Stair Management: Backwards;With walker Number of Stairs: 1 General stair comments: Pt practiced the small step, backwards with the RW. Was able to complete safely and without assist.     Exercises General Exercises - Lower Extremity Long Arc Quad: 10 reps;AAROM Other Exercises Other Exercises: Pt grunting and breathing very heavy during LAQ activity. Therapist assist appeared to be PROM, and no muscle initiation was felt in the quads during the activity.    PT Diagnosis:    PT Problem List:   PT Treatment Interventions:     PT Goals (current goals can now be found in the care plan section) Acute Rehab PT Goals Patient Stated Goal: To stop hurting and heal PT Goal Formulation: With patient/family Time For Goal Achievement: 10/09/13 Potential to Achieve Goals: Good  Visit Information  Last PT Received On: 09/28/13 Assistance Needed: +1 History of Present Illness: Pt is a 48 y/o male admitted with THA posterior approach.     Subjective Data  Subjective: Pt became somewhat agitated  when PT entered room, stating the pain pills don't help his pain, and that he is not doing well. Began getting out of bed on his own prior to therapist asking.  Patient Stated Goal: To stop hurting and  heal   Cognition  Cognition Arousal/Alertness: Awake/alert Behavior During Therapy: Agitated Overall Cognitive Status: Within Functional Limits for tasks assessed    Balance  Balance Overall balance assessment: Needs assistance (See "General Gait Details")  End of Session PT - End of Session Equipment Utilized During Treatment: Gait belt Activity Tolerance: Patient limited by pain Patient left: with call bell/phone within reach;in bed Nurse Communication: Mobility status   GP     Ruthann Cancer 09/28/2013, 12:41 PM  Ruthann Cancer, PT, DPT [phone]

## 2013-09-28 NOTE — Progress Notes (Signed)
PT Cancellation Note  Patient Details Name: Michael [name] MRN: [id number] DOB: June 30, 1966   Cancelled Treatment:    Reason Eval/Treat Not Completed: Pain limiting ability to participate;Fatigue/lethargy limiting ability to participate. Pt states he needs to save his energy for the trip home, and does not wish to participate in PT this afternoon. Therapist confirmed with pt that he feels comfortable returning home and has no further questions/concerns.    Ruthann CancerHamilton, Michael [name] 09/28/2013, 4:07 PM  Ruthann CancerLaura Hamilton, PT, DPT [phone]

## 2013-10-04 ENCOUNTER — Telehealth: Payer: Self-pay | Admitting: *Deleted

## 2013-10-04 NOTE — Telephone Encounter (Signed)
Pt currently at home following hip replacement surgery 09/25/13.  Discharged on 09/27/13 with prescription for dilaudid and robaxin.  Complaining of new problem, headache.  Unable to control headache with current medications. Advised by orthopedist to call his PCP for help with headache.

## 2013-10-05 ENCOUNTER — Ambulatory Visit (INDEPENDENT_AMBULATORY_CARE_PROVIDER_SITE_OTHER): Payer: Medicare Other | Admitting: Internal Medicine

## 2013-10-05 ENCOUNTER — Encounter: Payer: Self-pay | Admitting: Internal Medicine

## 2013-10-05 VITALS — BP 128/80 | HR 89 | Temp 97.9°F | Ht 69.0 in | Wt 168.5 lb

## 2013-10-05 DIAGNOSIS — B379 Candidiasis, unspecified: Secondary | ICD-10-CM | POA: Insufficient documentation

## 2013-10-05 DIAGNOSIS — B20 Human immunodeficiency virus [HIV] disease: Secondary | ICD-10-CM

## 2013-10-05 DIAGNOSIS — Z79899 Other long term (current) drug therapy: Secondary | ICD-10-CM

## 2013-10-05 MED ORDER — FLUCONAZOLE 100 MG PO TABS: 100.0000 mg | ORAL_TABLET | Freq: Every day | ORAL | Status: DC
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
## 2013-10-26 ENCOUNTER — Ambulatory Visit: Payer: Self-pay | Admitting: Internal Medicine

## 2013-11-07 NOTE — Progress Notes (Signed)
Patient is refusing SNF at this time and is adamant that he can return home. Clinical Social Worker will sign off for now as social work intervention is no longer needed. Please consult us again if new need arises.    Sabino NiemannAmy Mikailah Morel, MSW, [employer] [phone]

## 2013-12-07 ENCOUNTER — Other Ambulatory Visit: Payer: Self-pay | Admitting: *Deleted

## 2013-12-07 DIAGNOSIS — B20 Human immunodeficiency virus [HIV] disease: Secondary | ICD-10-CM

## 2013-12-07 MED ORDER — EMTRICITAB-RILPIVIR-TENOFOV DF 200-25-300 MG PO TABS: 1.0000 | ORAL_TABLET | Freq: Every day | ORAL | Status: DC

## 2013-12-14 ENCOUNTER — Other Ambulatory Visit: Payer: Self-pay

## 2013-12-28 ENCOUNTER — Ambulatory Visit: Payer: Self-pay | Admitting: Internal Medicine

## 2014-04-25 NOTE — Progress Notes (Signed)
Patient ID: Michael [name], male   DOB: 1966-08-16, 48 y.o.   MRN: [id number] [facility] [place]. CM advised [facility] [name] [facility].

## 2014-07-06 IMAGING — CT CT ABD-PELV W/ CM
1 of 3 series · 14 of 32 positions shown, 19 images · IV contrast (OMNIPAQUE 300)
Comparison: Ultrasound same day

CLINICAL DATA: Right lower quadrant pain extending to the scrotum.

CT ABDOMEN AND PELVIS WITH CONTRAST
TECHNIQUE: Multidetector CT imaging of the abdomen and pelvis was
performed following the standard protocol during bolus
administration of intravenous contrast.
Contrast: 80mL OMNIPAQUE IOHEXOL 300 MG/ML  SOLN

[Series 2: abd/pel with · axial · 0.67mm/px · z∈[-526,-166]mm · 14 of 82 slices shown, 19 images]
[im 5/82  soft-tissue]
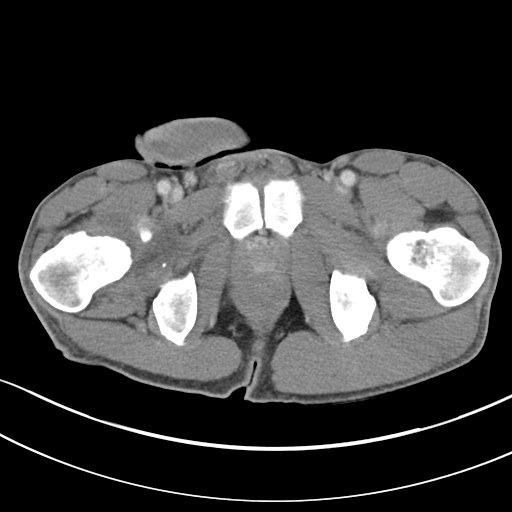
[im 5/82  bone]
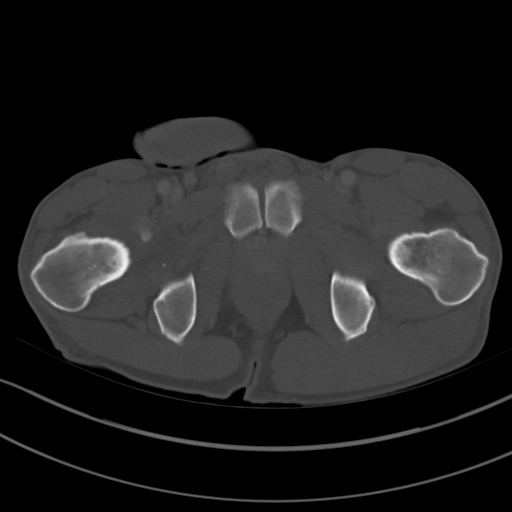
[im 10/82  soft-tissue]
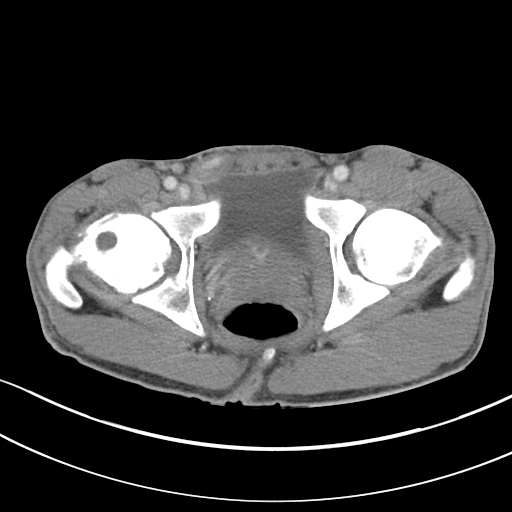
[im 19/82  soft-tissue]
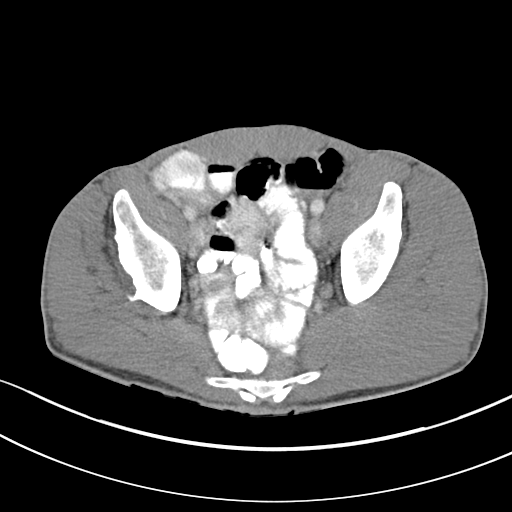
[im 23/82  soft-tissue]
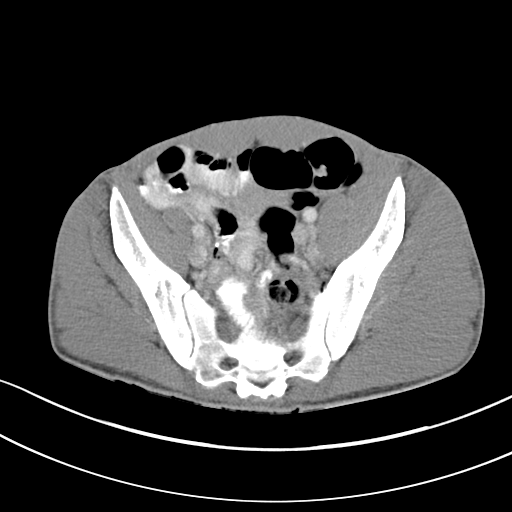
[im 28/82  soft-tissue]
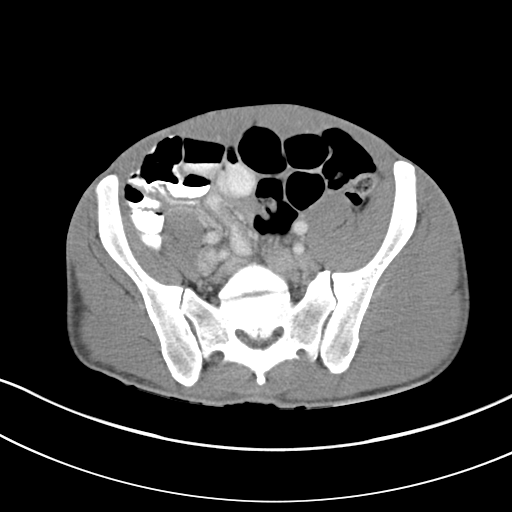
[im 37/82  soft-tissue]
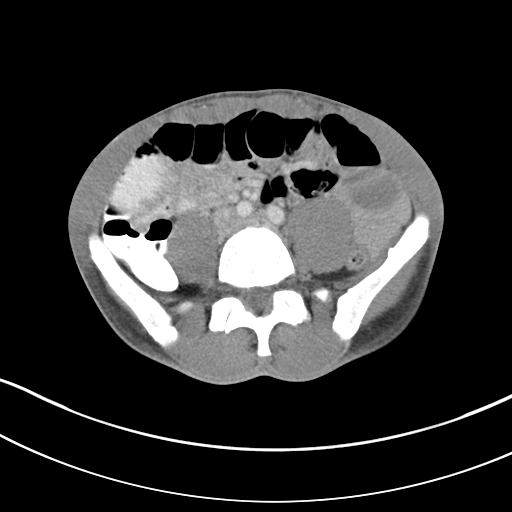
[im 41/82  soft-tissue]
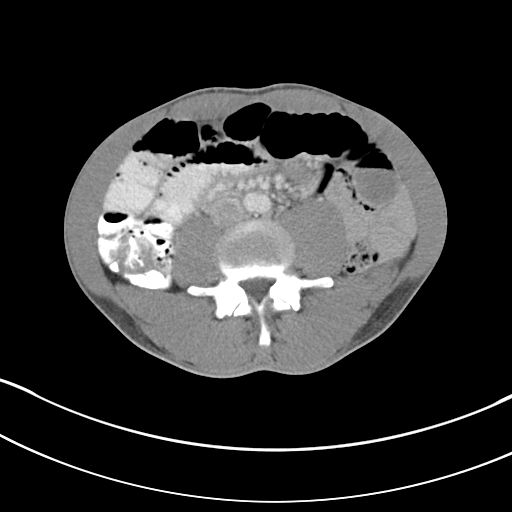
[im 46/82  soft-tissue]
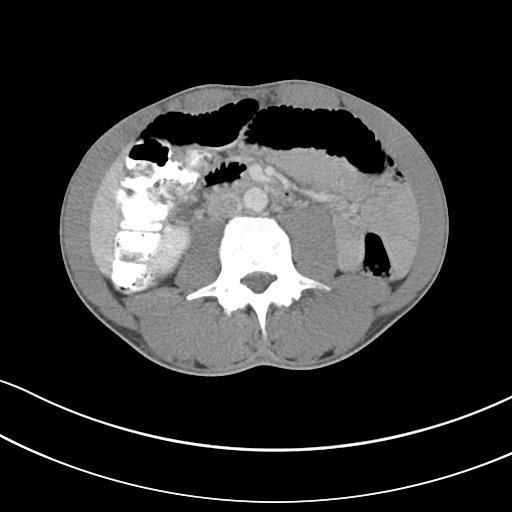
[im 55/82  soft-tissue]
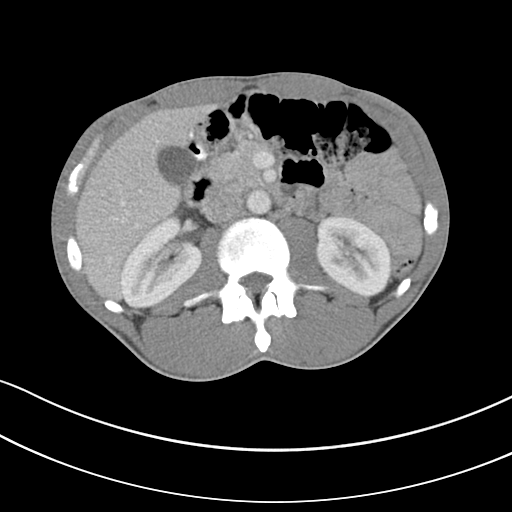
[im 55/82  bone]
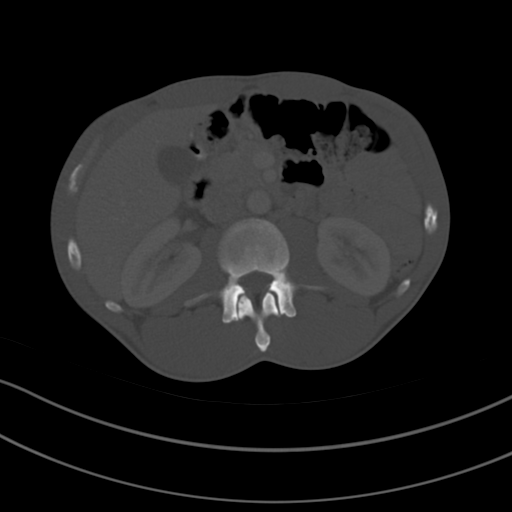
[im 59/82  soft-tissue]
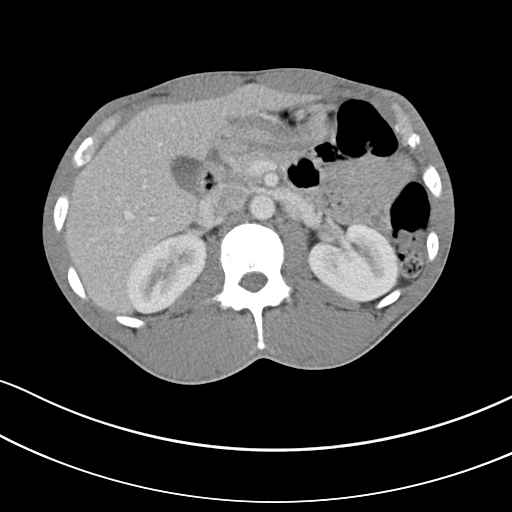
[im 64/82  soft-tissue]
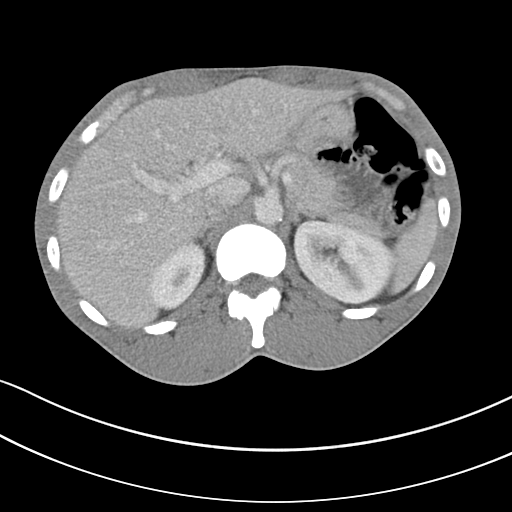
[im 64/82  lung]
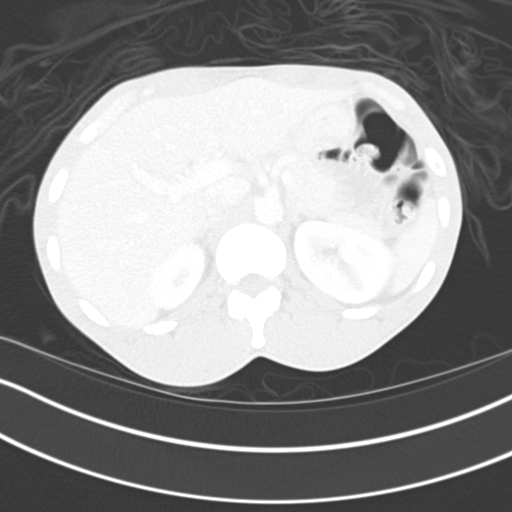
[im 68/82  lung]
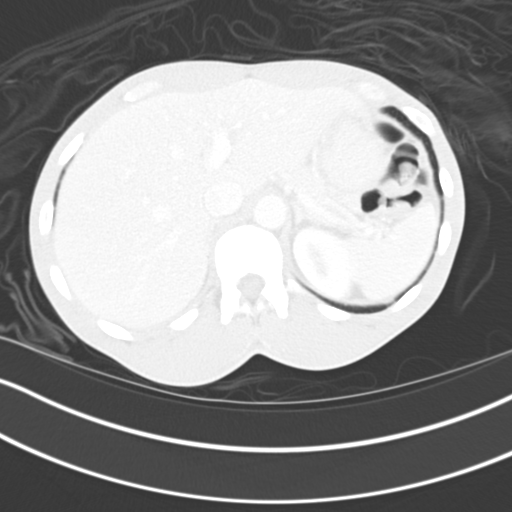
[im 73/82  soft-tissue]
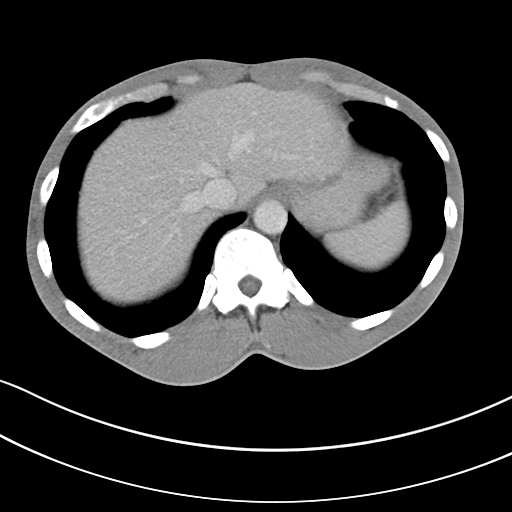
[im 73/82  lung]
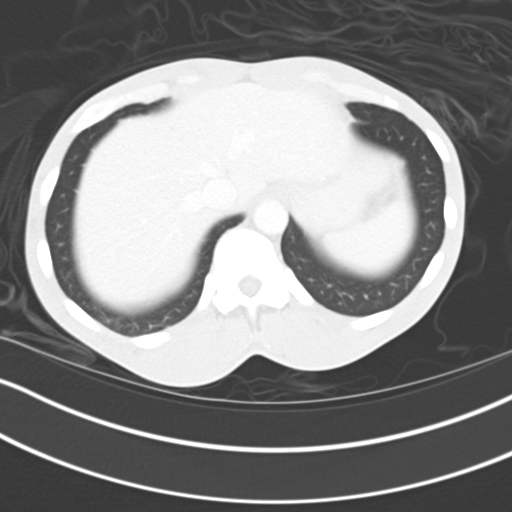
[im 77/82  soft-tissue]
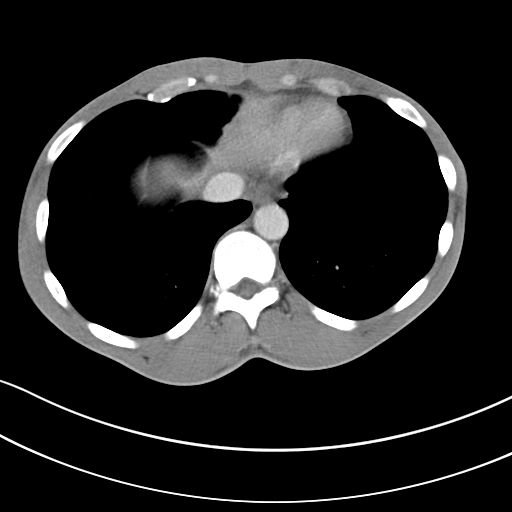
[im 77/82  lung]
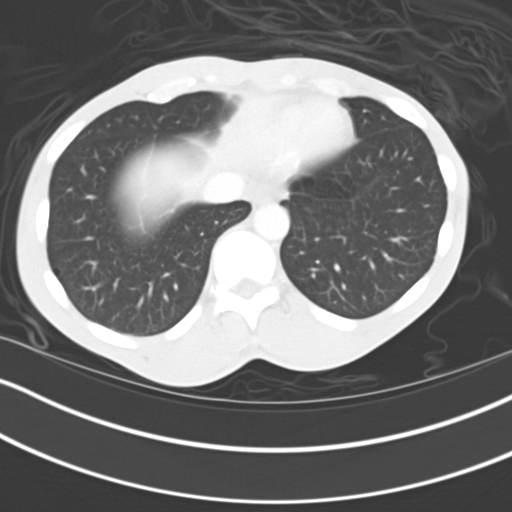

[14 of 32 positions shown; findings below may reference images not displayed]

FINDINGS: Lung bases are clear.  No pleural or pericardial fluid.

The liver has a normal appearance without focal lesions or biliary
ductal dilatation.  No calcified gallstones.  The spleen is normal.
The pancreas is normal.  The adrenal glands are normal.  The
kidneys are normal.  No cyst, mass, stone or hydronephrosis.  The
aorta and IVC are normal.  No retroperitoneal mass or adenopathy.
No free fluid in the pelvis.  The bladder appears unremarkable.
The prostate gland is prominent.  The calcification in the central
prostate is presumed represent a concretion, though a stone in the
prostatic urethra is not completely excluded.

I think there is a right inguinal hernia which contains some fat
alternatively, one could argue that this could be due to
inflammation of the spermatic cord.

No primary bowel pathology is seen.  The appendix is normal.

There is advanced arthritis of the hips, more extensive on the
right than the left.  This appears probably to be due to chronic
avascular necrosis with collapse on the right.
IMPRESSION: There is increased tissue in the right spermatic cord region.  This
could be due to a inguinal hernia containing some mesentery.
Alternatively, the spermatic cord could be inflamed.

The prostate is enlarged.  There is a central calcification
presumed represent a concretion.  A small stone in a prostatic
urethra is not excluded, but not actually favored.

Advanced arthropathy of the hips, probably secondary to chronic
avascular necrosis, more extensive on the right than the left.

## 2014-07-14 ENCOUNTER — Other Ambulatory Visit: Payer: Self-pay | Admitting: *Deleted

## 2014-07-14 DIAGNOSIS — B20 Human immunodeficiency virus [HIV] disease: Secondary | ICD-10-CM

## 2014-07-14 MED ORDER — EMTRICITAB-RILPIVIR-TENOFOV DF 200-25-300 MG PO TABS: 1.0000 | ORAL_TABLET | Freq: Every day | ORAL | Status: DC

## 2014-07-14 NOTE — Telephone Encounter (Signed)
Pt needs MD and Lab appts ASAP.

## 2014-08-15 ENCOUNTER — Other Ambulatory Visit: Payer: Self-pay | Admitting: Internal Medicine

## 2014-08-15 DIAGNOSIS — B20 Human immunodeficiency virus [HIV] disease: Secondary | ICD-10-CM

## 2014-08-15 NOTE — Telephone Encounter (Signed)
Pt has Medicare Part D [facility] [id number].  [facility] has been filling Complera through Part D.  Pt needs to make return appt for January 2016.  Last appt January 2015.  Message left for [facility], [facility] to find out about SPAP application.   Contact pt and made appt for 08/25/14 w/Dr. Drue SecondSnider for 9:30.  Labs to be drawn at time of visit.

## 2014-08-25 ENCOUNTER — Ambulatory Visit: Payer: Self-pay | Admitting: Internal Medicine

## 2014-09-27 ENCOUNTER — Other Ambulatory Visit: Payer: Self-pay | Admitting: Internal Medicine

## 2014-10-06 ENCOUNTER — Ambulatory Visit: Payer: Medicare Other | Admitting: Internal Medicine

## 2014-10-06 ENCOUNTER — Other Ambulatory Visit (INDEPENDENT_AMBULATORY_CARE_PROVIDER_SITE_OTHER): Payer: Medicare Other

## 2014-10-06 DIAGNOSIS — Z113 Encounter for screening for infections with a predominantly sexual mode of transmission: Secondary | ICD-10-CM

## 2014-10-06 DIAGNOSIS — Z79899 Other long term (current) drug therapy: Secondary | ICD-10-CM

## 2014-10-06 DIAGNOSIS — B20 Human immunodeficiency virus [HIV] disease: Secondary | ICD-10-CM

## 2014-10-06 LAB — COMPLETE METABOLIC PANEL WITH GFR
ALBUMIN: 4.1 g/dL (ref 3.5–5.2)
ALT: 13 U/L (ref 0–53)
AST: 14 U/L (ref 0–37)
Alkaline Phosphatase: 102 U/L (ref 39–117)
BUN: 20 mg/dL (ref 6–23)
CALCIUM: 9.5 mg/dL (ref 8.4–10.5)
CO2: 24 mEq/L (ref 19–32)
Chloride: 106 mEq/L (ref 96–112)
Creat: 1.17 mg/dL (ref 0.50–1.35)
GFR, EST AFRICAN AMERICAN: 86 mL/min
GFR, EST NON AFRICAN AMERICAN: 74 mL/min
GLUCOSE: 93 mg/dL (ref 70–99)
Potassium: 4 mEq/L (ref 3.5–5.3)
Sodium: 140 mEq/L (ref 135–145)
Total Bilirubin: 0.4 mg/dL (ref 0.2–1.2)
Total Protein: 7 g/dL (ref 6.0–8.3)

## 2014-10-06 LAB — LIPID PANEL
CHOLESTEROL: 141 mg/dL (ref 0–200)
HDL: 55 mg/dL (ref >39)
LDL Cholesterol: 61 mg/dL (ref 0–99)
Total CHOL/HDL Ratio: 2.6 Ratio
Triglycerides: 127 mg/dL (ref <150)
VLDL: 25 mg/dL (ref 0–40)

## 2014-10-06 LAB — CBC WITH DIFFERENTIAL/PLATELET
Basophils Absolute: 0 10*3/uL (ref 0.0–0.1)
Basophils Relative: 0 % (ref 0–1)
Eosinophils Absolute: 0.1 10*3/uL (ref 0.0–0.7)
Eosinophils Relative: 1 % (ref 0–5)
HCT: 45.1 % (ref 39.0–52.0)
Hemoglobin: 15.5 g/dL (ref 13.0–17.0)
LYMPHS PCT: 24 % (ref 12–46)
Lymphs Abs: 2.5 10*3/uL (ref 0.7–4.0)
MCH: 33.8 pg (ref 26.0–34.0)
MCHC: 34.4 g/dL (ref 30.0–36.0)
MCV: 98.3 fL (ref 78.0–100.0)
MONOS PCT: 7 % (ref 3–12)
MPV: 10.2 fL (ref 8.6–12.4)
Monocytes Absolute: 0.7 10*3/uL (ref 0.1–1.0)
Neutro Abs: 7.1 10*3/uL (ref 1.7–7.7)
Neutrophils Relative %: 68 % (ref 43–77)
PLATELETS: 264 10*3/uL (ref 150–400)
RBC: 4.59 MIL/uL (ref 4.22–5.81)
RDW: 15.1 % (ref 11.5–15.5)
WBC: 10.4 10*3/uL (ref 4.0–10.5)

## 2014-10-06 LAB — RPR

## 2014-10-07 ENCOUNTER — Other Ambulatory Visit: Payer: Self-pay | Admitting: *Deleted

## 2014-10-07 DIAGNOSIS — B20 Human immunodeficiency virus [HIV] disease: Secondary | ICD-10-CM

## 2014-10-07 LAB — T-HELPER CELL (CD4) - (RCID CLINIC ONLY)
CD4 % Helper T Cell: 36 % (ref 33–55)
CD4 T Cell Abs: 920 /uL (ref 400–2700)

## 2014-10-07 LAB — HIV-1 RNA QUANT-NO REFLEX-BLD
HIV 1 RNA Quant: <20 copies/mL (ref <20)
HIV-1 RNA Quant, Log: <1.3 {Log} (ref <1.30)

## 2014-10-07 LAB — URINE CYTOLOGY ANCILLARY ONLY
Chlamydia: NEGATIVE
NEISSERIA GONORRHEA: NEGATIVE

## 2014-10-07 MED ORDER — EMTRICITAB-RILPIVIR-TENOFOV DF 200-25-300 MG PO TABS
1.0000 | ORAL_TABLET | Freq: Every day | ORAL | Status: DC
Start: 2014-10-07 — End: 2014-11-07

## 2014-10-07 NOTE — Addendum Note (Signed)
Addended by: Lurlean LeydenPOOLE, TRAVIS F on: 10/07/2014 09:36 AM   Modules accepted: Orders

## 2014-11-07 ENCOUNTER — Other Ambulatory Visit: Payer: Self-pay | Admitting: Internal Medicine

## 2014-11-07 DIAGNOSIS — B20 Human immunodeficiency virus [HIV] disease: Secondary | ICD-10-CM

## 2014-11-21 ENCOUNTER — Other Ambulatory Visit: Payer: Self-pay | Admitting: *Deleted

## 2014-11-21 ENCOUNTER — Other Ambulatory Visit: Payer: Self-pay | Admitting: Internal Medicine

## 2014-11-21 NOTE — Telephone Encounter (Signed)
Pt requesting Complera rx - Patient has not been seen since Jan, 2015, need to make and keep appt.  Unable to reach pt by phone numbers listed.

## 2014-11-28 ENCOUNTER — Other Ambulatory Visit: Payer: Self-pay | Admitting: Internal Medicine

## 2014-11-28 ENCOUNTER — Telehealth: Payer: Self-pay | Admitting: *Deleted

## 2014-11-28 DIAGNOSIS — B20 Human immunodeficiency virus [HIV] disease: Secondary | ICD-10-CM

## 2014-11-28 NOTE — Telephone Encounter (Signed)
Received refill request for complera. Notified pharmacy that the patient will not receive any more refills of complera until he is seen by a physician. RN authorized 1 month. Patient did have labs drawn 09/2014 and was undetectable, but has not seen a physician since 09/2013.  Pharmacy tech stated she would notify patient, as we have been unable to contact him. Andree CossHowell, Gotti Alwin M, RN

## 2014-12-05 ENCOUNTER — Emergency Department: Payer: Self-pay | Admitting: Emergency Medicine

## 2014-12-05 LAB — URINALYSIS, COMPLETE
Bacteria: NONE SEEN
Bilirubin,UR: NEGATIVE
Blood: NEGATIVE
Glucose,UR: NEGATIVE mg/dL (ref 0–75)
NITRITE: NEGATIVE
PROTEIN: NEGATIVE
Ph: 6 (ref 4.5–8.0)
Specific Gravity: 1.016 (ref 1.003–1.030)
Squamous Epithelial: NONE SEEN
WBC UR: 19 /HPF (ref 0–5)

## 2014-12-05 LAB — COMPREHENSIVE METABOLIC PANEL
ALBUMIN: 3.9 g/dL
AST: 17 U/L
Alkaline Phosphatase: 114 U/L
Anion Gap: 7 (ref 7–16)
BILIRUBIN TOTAL: 0.5 mg/dL
BUN: 9 mg/dL
Calcium, Total: 9 mg/dL
Chloride: 103 mmol/L
Co2: 26 mmol/L
Creatinine: 0.91 mg/dL
EGFR (Non-African Amer.): >60
Glucose: 92 mg/dL
Potassium: 3.7 mmol/L
SGPT (ALT): 13 U/L — ABNORMAL LOW
SODIUM: 136 mmol/L
Total Protein: 7.8 g/dL

## 2014-12-05 LAB — CBC
HCT: 46.3 % (ref 40.0–52.0)
HGB: 15.5 g/dL (ref 13.0–18.0)
MCH: 33.7 pg (ref 26.0–34.0)
MCHC: 33.4 g/dL (ref 32.0–36.0)
MCV: 101 fL — ABNORMAL HIGH (ref 80–100)
PLATELETS: 274 10*3/uL (ref 150–440)
RBC: 4.59 10*6/uL (ref 4.40–5.90)
RDW: 14.6 % — AB (ref 11.5–14.5)
WBC: 18.3 10*3/uL — AB (ref 3.8–10.6)

## 2014-12-05 LAB — GC/CHLAMYDIA PROBE AMP

## 2014-12-26 ENCOUNTER — Other Ambulatory Visit: Payer: Self-pay | Admitting: Internal Medicine

## 2015-04-04 ENCOUNTER — Other Ambulatory Visit: Payer: Self-pay | Admitting: Infectious Diseases

## 2015-04-10 ENCOUNTER — Other Ambulatory Visit: Payer: Self-pay | Admitting: Infectious Diseases

## 2015-04-18 ENCOUNTER — Other Ambulatory Visit: Payer: Self-pay | Admitting: Licensed Clinical Social Worker

## 2015-04-18 MED ORDER — EMTRICITAB-RILPIVIR-TENOFOV DF 200-25-300 MG PO TABS: ORAL_TABLET | ORAL | Status: DC

## 2015-08-15 ENCOUNTER — Other Ambulatory Visit: Payer: Self-pay | Admitting: Infectious Diseases

## 2015-08-17 ENCOUNTER — Other Ambulatory Visit: Payer: Self-pay | Admitting: Infectious Diseases

## 2015-08-18 ENCOUNTER — Other Ambulatory Visit: Payer: Self-pay | Admitting: Infectious Diseases

## 2015-08-18 NOTE — Telephone Encounter (Signed)
Pt has not been seen at [facility] since 09/2013.  Pt will need appointment first before refill.  Requested pt's mother give him the message to call [facility].

## 2015-09-02 ENCOUNTER — Encounter: Payer: Self-pay | Admitting: Emergency Medicine

## 2015-09-02 ENCOUNTER — Emergency Department: Payer: No Typology Code available for payment source

## 2015-09-02 ENCOUNTER — Emergency Department
Admission: EM | Admit: 2015-09-02 | Discharge: 2015-09-02 | Disposition: A | Discharge status: Home / Self Care | Payer: No Typology Code available for payment source | Attending: Emergency Medicine | Admitting: Emergency Medicine

## 2015-09-02 DIAGNOSIS — Z7901 Long term (current) use of anticoagulants: Secondary | ICD-10-CM | POA: Insufficient documentation

## 2015-09-02 DIAGNOSIS — S8992XA Unspecified injury of left lower leg, initial encounter: Secondary | ICD-10-CM | POA: Diagnosis not present

## 2015-09-02 DIAGNOSIS — Z87891 Personal history of nicotine dependence: Secondary | ICD-10-CM | POA: Diagnosis not present

## 2015-09-02 DIAGNOSIS — Y998 Other external cause status: Secondary | ICD-10-CM | POA: Insufficient documentation

## 2015-09-02 DIAGNOSIS — S199XXA Unspecified injury of neck, initial encounter: Secondary | ICD-10-CM | POA: Diagnosis present

## 2015-09-02 DIAGNOSIS — S139XXA Sprain of joints and ligaments of unspecified parts of neck, initial encounter: Secondary | ICD-10-CM

## 2015-09-02 DIAGNOSIS — Y92410 Unspecified street and highway as the place of occurrence of the external cause: Secondary | ICD-10-CM | POA: Insufficient documentation

## 2015-09-02 DIAGNOSIS — S134XXA Sprain of ligaments of cervical spine, initial encounter: Secondary | ICD-10-CM | POA: Diagnosis not present

## 2015-09-02 DIAGNOSIS — Z79899 Other long term (current) drug therapy: Secondary | ICD-10-CM | POA: Insufficient documentation

## 2015-09-02 DIAGNOSIS — S4992XA Unspecified injury of left shoulder and upper arm, initial encounter: Secondary | ICD-10-CM | POA: Diagnosis not present

## 2015-09-02 DIAGNOSIS — Y9389 Activity, other specified: Secondary | ICD-10-CM | POA: Insufficient documentation

## 2015-09-02 DIAGNOSIS — Z792 Long term (current) use of antibiotics: Secondary | ICD-10-CM | POA: Diagnosis not present

## 2015-09-02 NOTE — Discharge Instructions (Signed)
Please seek medical attention for any high fevers, chest pain, shortness of breath, change in behavior, persistent vomiting, bloody stool or any other new or concerning symptoms. ° ° °Cervical Sprain °A cervical sprain is an injury in the neck in which the strong, fibrous tissues (ligaments) that connect your neck bones stretch or tear. Cervical sprains can range from mild to severe. Severe cervical sprains can cause the neck vertebrae to be unstable. This can lead to damage of the spinal cord and can result in serious nervous system problems. The amount of time it takes for a cervical sprain to get better depends on the cause and extent of the injury. Most cervical sprains heal in 1 to 3 weeks. °CAUSES  °Severe cervical sprains may be caused by:  °· Contact sport injuries (such as from football, rugby, wrestling, hockey, auto racing, gymnastics, diving, martial arts, or boxing).   °· Motor vehicle collisions.   °· Whiplash injuries. This is an injury from a sudden forward and backward whipping movement of the head and neck.  °· Falls.   °Mild cervical sprains may be caused by:  °· Being in an awkward position, such as while cradling a telephone between your ear and shoulder.   °· Sitting in a chair that does not offer proper support.   °· Working at a poorly designed computer station.   °· Looking up or down for long periods of time.   °SYMPTOMS  °· Pain, soreness, stiffness, or a burning sensation in the front, back, or sides of the neck. This discomfort may develop immediately after the injury or slowly, 24 hours or more after the injury.   °· Pain or tenderness directly in the middle of the back of the neck.   °· Shoulder or upper back pain.   °· Limited ability to move the neck.   °· Headache.   °· Dizziness.   °· Weakness, numbness, or tingling in the hands or arms.   °· Muscle spasms.   °· Difficulty swallowing or chewing.   °· Tenderness and swelling of the neck.   °DIAGNOSIS  °Most of the time your health  care provider can diagnose a cervical sprain by taking your history and doing a physical exam. Your health care provider will ask about previous neck injuries and any known neck problems, such as arthritis in the neck. X-rays may be taken to find out if there are any other problems, such as with the bones of the neck. Other tests, such as a CT scan or MRI, may also be needed.  °TREATMENT  °Treatment depends on the severity of the cervical sprain. Mild sprains can be treated with rest, keeping the neck in place (immobilization), and pain medicines. Severe cervical sprains are immediately immobilized. Further treatment is done to help with pain, muscle spasms, and other symptoms and may include: °· Medicines, such as pain relievers, numbing medicines, or muscle relaxants.   °· Physical therapy. This may involve stretching exercises, strengthening exercises, and posture training. Exercises and improved posture can help stabilize the neck, strengthen muscles, and help stop symptoms from returning.   °HOME CARE INSTRUCTIONS  °· Put ice on the injured area.   °¨ Put ice in a plastic bag.   °¨ Place a towel between your skin and the bag.   °¨ Leave the ice on for 15-20 minutes, 3-4 times a day.   °· If your injury was severe, you may have been given a cervical collar to wear. A cervical collar is a two-piece collar designed to keep your neck from moving while it heals. °¨ Do not remove the collar unless instructed by your   health care provider. °¨ If you have long hair, keep it outside of the collar. °¨ Ask your health care provider before making any adjustments to your collar. Minor adjustments may be required over time to improve comfort and reduce pressure on your chin or on the back of your head. °¨ If you are allowed to remove the collar for cleaning or bathing, follow your health care provider's instructions on how to do so safely. °¨ Keep your collar clean by wiping it with mild soap and water and drying it  completely. If the collar you have been given includes removable pads, remove them every 1-2 days and hand wash them with soap and water. Allow them to air dry. They should be completely dry before you wear them in the collar. °¨ If you are allowed to remove the collar for cleaning and bathing, wash and dry the skin of your neck. Check your skin for irritation or sores. If you see any, tell your health care provider. °¨ Do not drive while wearing the collar.   °· Only take over-the-counter or prescription medicines for pain, discomfort, or fever as directed by your health care provider.   °· Keep all follow-up appointments as directed by your health care provider.   °· Keep all physical therapy appointments as directed by your health care provider.   °· Make any needed adjustments to your workstation to promote good posture.   °· Avoid positions and activities that make your symptoms worse.   °· Warm up and stretch before being active to help prevent problems.   °SEEK MEDICAL CARE IF:  °· Your pain is not controlled with medicine.   °· You are unable to decrease your pain medicine over time as planned.   °· Your activity level is not improving as expected.   °SEEK IMMEDIATE MEDICAL CARE IF:  °· You develop any bleeding. °· You develop stomach upset. °· You have signs of an allergic reaction to your medicine.   °· Your symptoms get worse.   °· You develop new, unexplained symptoms.   °· You have numbness, tingling, weakness, or paralysis in any part of your body.   °MAKE SURE YOU:  °· Understand these instructions. °· Will watch your condition. °· Will get help right away if you are not doing well or get worse. °  °This information is not intended to replace advice given to you by your health care provider. Make sure you discuss any questions you have with your health care provider. °  °Document Released: 06/23/2007 Document Revised: 08/31/2013 Document Reviewed: 03/03/2013 °Elsevier Interactive Patient Education  ©2016 [employer]. ° °
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
## 2015-10-16 ENCOUNTER — Other Ambulatory Visit: Payer: Self-pay | Admitting: Infectious Diseases

## 2015-12-29 ENCOUNTER — Other Ambulatory Visit: Payer: Self-pay | Admitting: Infectious Diseases

## 2016-01-11 ENCOUNTER — Encounter: Payer: Self-pay | Admitting: Internal Medicine

## 2016-01-11 ENCOUNTER — Other Ambulatory Visit (HOSPITAL_COMMUNITY)
Admission: RE | Admit: 2016-01-11 | Discharge: 2016-01-11 | Disposition: A | Discharge status: Home / Self Care | Source: Ambulatory Visit | Attending: Internal Medicine | Admitting: Internal Medicine

## 2016-01-11 ENCOUNTER — Ambulatory Visit (INDEPENDENT_AMBULATORY_CARE_PROVIDER_SITE_OTHER): Payer: Medicare Other | Admitting: Internal Medicine

## 2016-01-11 VITALS — BP 112/75 | Temp 97.8°F | Ht 69.0 in | Wt 172.0 lb

## 2016-01-11 DIAGNOSIS — Z113 Encounter for screening for infections with a predominantly sexual mode of transmission: Secondary | ICD-10-CM | POA: Insufficient documentation

## 2016-01-11 DIAGNOSIS — B20 Human immunodeficiency virus [HIV] disease: Secondary | ICD-10-CM

## 2016-01-11 LAB — CBC WITH DIFFERENTIAL/PLATELET
BASOS PCT: 0 %
Basophils Absolute: 0 cells/uL (ref 0–200)
EOS ABS: 154 {cells}/uL (ref 15–500)
Eosinophils Relative: 2 %
HEMATOCRIT: 44.4 % (ref 38.5–50.0)
Hemoglobin: 15.2 g/dL (ref 13.2–17.1)
LYMPHS PCT: 40 %
Lymphs Abs: 3080 cells/uL (ref 850–3900)
MCH: 33.9 pg — ABNORMAL HIGH (ref 27.0–33.0)
MCHC: 34.2 g/dL (ref 32.0–36.0)
MCV: 99.1 fL (ref 80.0–100.0)
MONO ABS: 539 {cells}/uL (ref 200–950)
MONOS PCT: 7 %
MPV: 10.1 fL (ref 7.5–12.5)
NEUTROS PCT: 51 %
Neutro Abs: 3927 cells/uL (ref 1500–7800)
PLATELETS: 232 10*3/uL (ref 140–400)
RBC: 4.48 MIL/uL (ref 4.20–5.80)
RDW: 13.8 % (ref 11.0–15.0)
WBC: 7.7 10*3/uL (ref 3.8–10.8)

## 2016-01-11 LAB — COMPLETE METABOLIC PANEL WITH GFR
ALT: 43 U/L (ref 9–46)
AST: 49 U/L — ABNORMAL HIGH (ref 10–40)
Albumin: 4.2 g/dL (ref 3.6–5.1)
Alkaline Phosphatase: 85 U/L (ref 40–115)
BUN: 18 mg/dL (ref 7–25)
CALCIUM: 9.6 mg/dL (ref 8.6–10.3)
CHLORIDE: 103 mmol/L (ref 98–110)
CO2: 25 mmol/L (ref 20–31)
Creat: 0.96 mg/dL (ref 0.60–1.35)
Glucose, Bld: 80 mg/dL (ref 65–99)
POTASSIUM: 4.2 mmol/L (ref 3.5–5.3)
Sodium: 139 mmol/L (ref 135–146)
Total Bilirubin: 0.2 mg/dL (ref 0.2–1.2)
Total Protein: 6.8 g/dL (ref 6.1–8.1)

## 2016-01-11 MED ORDER — EMTRICITAB-RILPIVIR-TENOFOV DF 200-25-300 MG PO TABS: ORAL_TABLET | ORAL | Status: DC

## 2016-01-11 NOTE — Progress Notes (Signed)
Patient ID: Michael [name], male   DOB: 06-30-1966, 50 y.o.   MRN: [id number]       Patient ID: Michael [name], male   DOB: 1965/11/14, 50 y.o.   MRN: [id number]  HPI Azan Maneri is a 50yo M with HIV disease, CD 4 count of 920/VL<20( in Jan 2016). His genotype in 2012 wildtype. He previously was on complera but now incarcerated. He is receiving his medication while in jail. He takes it with breakfast ( usually around 700-1000calorie meal). He is doing well overall though concerned that he has not had recent labs.   He has anal warts hx Right knee pain   Outpatient Encounter Prescriptions as of 01/11/2016  Medication Sig  . docusate sodium 100 MG CAPS Take 100 mg by mouth 2 (two) times daily.  . Emtricitab-Rilpivir-Tenofov DF (COMPLERA) 200-25-300 MG TABS TAKE 1 TABLET BY MOUTH DAILY (NEEDS APPT WITH DOCTOR)  . enoxaparin (LOVENOX) 30 MG/0.3ML injection Inject 0.3 mLs (30 mg total) into the skin every 12 (twelve) hours.  . fluconazole (DIFLUCAN) 100 MG tablet Take 1 tablet (100 mg total) by mouth daily.  Marland Kitchen HYDROmorphone (DILAUDID) 2 MG tablet 1-2 tabs po q4-6hrs prn pain  . methocarbamol (ROBAXIN-750) 750 MG tablet Take 1 tablet (750 mg total) by mouth 4 (four) times daily.   No facility-administered encounter medications on file as of 01/11/2016.     Patient Active Problem List   Diagnosis Date Noted  . Candidiasis 10/05/2013  . Avascular necrosis of bone of right hip (HCC) 09/24/2013  . Substance abuse 02/24/2013  . Gonorrhea 07/10/2012  . 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 Severe malnutrition (HCC) 07/09/2012  . Epididymitis, right 07/08/2012  . Leukocytosis 07/08/2012  . Contact dermatitis 10/21/2011  . Periodontal disease 10/21/2011  . Warts 10/10/2011  . Underweight 03/22/2011  . DENTAL CARIES 01/19/2009  . HIV DISEASE 12/05/2008  . ACUTE GINGIVITIS PLAQUE INDUCED 12/05/2008  . ASEPTIC NECROSIS, FEMUR HEAD/NECK 12/05/2008  . PERSONAL HISTORY OF TRAUMATIC BRAIN  INJURY 12/05/2008     There are no preventive care reminders to display for this patient.   Review of Systems Per hpi, otehrwise 10 point ros is negative Physical Exam   BP 112/75 mmHg  Temp(Src) 97.8 F (36.6 C) (Oral)  Ht  (1.753 m)  Wt 172 lb (78.019 kg)  BMI 25.39 kg/m2 Physical Exam  Constitutional: He is oriented to person, place, and time. He appears well-developed and well-nourished. No distress.  HENT:  Mouth/Throat: Oropharynx is clear and moist. No oropharyngeal exudate.  Cardiovascular: Normal rate, regular rhythm and normal heart sounds. Exam reveals no gallop and no friction rub.  No murmur heard.  Pulmonary/Chest: Effort normal and breath sounds normal. No respiratory distress. He has no wheezes.  Abdominal: Soft. Bowel sounds are normal. He exhibits no distension. There is no tenderness.  Lymphadenopathy:  He has no cervical adenopathy.  Neurological: He is alert and oriented to person, place, and time.  Skin: Skin is warm and dry. No rash noted. No erythema.  Psychiatric: He has a normal mood and affect. His behavior is normal.    Lab Results  Component Value Date   CD4TCELL 36 10/06/2014   Lab Results  Component Value Date   CD4TABS 920 10/06/2014   CD4TABS 1020 08/26/2013   CD4TABS 1030 08/20/2012   Lab Results  Component Value Date   HIV1RNAQUANT <20 10/06/2014   Lab Results  Component Value Date   HEPBSAB NEG 04/14/2008   No results found  for: RPR  CBC Lab Results  Component Value Date   WBC 18.3* 12/05/2014   RBC 4.59 12/05/2014   HGB 15.5 12/05/2014   HCT 46.3 12/05/2014   PLT 274 12/05/2014   MCV 101* 12/05/2014   MCH 33.7 12/05/2014   MCHC 33.4 12/05/2014   RDW 14.6* 12/05/2014   LYMPHSABS 2.5 10/06/2014   MONOABS 0.7 10/06/2014   EOSABS 0.1 10/06/2014   BASOSABS 0.0 10/06/2014   BMET Lab Results  Component Value Date   NA 136 12/05/2014   K 3.7 12/05/2014   CL 103 12/05/2014   CO2 26 12/05/2014   GLUCOSE 92  12/05/2014   BUN 9 12/05/2014   CREATININE 0.91 12/05/2014   CALCIUM 9.0 12/05/2014   GFRNONAA >60 12/05/2014   GFRAA >60 12/05/2014     Assessment and Plan  hiv = continue on complera. Check labs  Health maintenance = gc/chlam oral, rpr. Once he is released will do rectal exam   Anal warts = will set up for hra in next 6 months  Right knee pain = physical exam is benign, suggestive that this may be early oa. Can use prn ibuprofen to help with pain

## 2016-01-12 LAB — HEPATITIS A ANTIBODY, TOTAL: Hep A Total Ab: NONREACTIVE

## 2016-01-12 LAB — HEPATITIS B SURFACE ANTIBODY,QUALITATIVE: HEP B S AB: NEGATIVE

## 2016-01-12 LAB — T-HELPER CELL (CD4) - (RCID CLINIC ONLY)
CD4 % Helper T Cell: 31 % — ABNORMAL LOW (ref 33–55)
CD4 T Cell Abs: 940 /uL (ref 400–2700)

## 2016-01-12 LAB — RPR

## 2016-01-12 LAB — HEPATITIS C ANTIBODY: HCV Ab: NEGATIVE

## 2016-01-15 LAB — CYTOLOGY, (ORAL, ANAL, URETHRAL) ANCILLARY ONLY
Chlamydia: NEGATIVE
Neisseria Gonorrhea: NEGATIVE

## 2016-01-15 LAB — HIV-1 RNA QUANT-NO REFLEX-BLD
HIV 1 RNA Quant: 271 copies/mL — ABNORMAL HIGH (ref <20)
HIV-1 RNA QUANT, LOG: 2.43 {Log_copies}/mL — AB (ref <1.30)

## 2016-03-07 ENCOUNTER — Encounter (HOSPITAL_COMMUNITY): Payer: Self-pay | Admitting: *Deleted

## 2016-03-07 ENCOUNTER — Emergency Department (HOSPITAL_COMMUNITY)
Admission: EM | Admit: 2016-03-07 | Discharge: 2016-03-07 | Disposition: A | Discharge status: Home / Self Care | Payer: Medicare Other | Attending: Emergency Medicine | Admitting: Emergency Medicine

## 2016-03-07 DIAGNOSIS — W57XXXA Bitten or stung by nonvenomous insect and other nonvenomous arthropods, initial encounter: Secondary | ICD-10-CM | POA: Diagnosis not present

## 2016-03-07 DIAGNOSIS — Y929 Unspecified place or not applicable: Secondary | ICD-10-CM | POA: Diagnosis not present

## 2016-03-07 DIAGNOSIS — Y939 Activity, unspecified: Secondary | ICD-10-CM | POA: Diagnosis not present

## 2016-03-07 DIAGNOSIS — Y990 Civilian activity done for income or pay: Secondary | ICD-10-CM | POA: Diagnosis not present

## 2016-03-07 DIAGNOSIS — S0086XA Insect bite (nonvenomous) of other part of head, initial encounter: Secondary | ICD-10-CM | POA: Insufficient documentation

## 2016-03-07 DIAGNOSIS — Z5321 Procedure and treatment not carried out due to patient leaving prior to being seen by health care provider: Secondary | ICD-10-CM

## 2016-03-07 NOTE — ED Notes (Signed)
Pt eloped from room without telling this RN or any other staff member.

## 2016-03-07 NOTE — ED Notes (Signed)
Pt states he was at work today and believes he got stung on the back of the head by a bee. No visible bite.

## 2016-08-18 ENCOUNTER — Emergency Department: Payer: Medicare Other

## 2016-08-18 ENCOUNTER — Emergency Department
Admission: EM | Admit: 2016-08-18 | Discharge: 2016-08-18 | Disposition: A | Discharge status: Home / Self Care | Payer: Medicare Other | Attending: Emergency Medicine | Admitting: Emergency Medicine

## 2016-08-18 ENCOUNTER — Encounter: Payer: Self-pay | Admitting: Emergency Medicine

## 2016-08-18 DIAGNOSIS — Z21 Asymptomatic human immunodeficiency virus [HIV] infection status: Secondary | ICD-10-CM | POA: Diagnosis not present

## 2016-08-18 DIAGNOSIS — A64 Unspecified sexually transmitted disease: Secondary | ICD-10-CM | POA: Diagnosis not present

## 2016-08-18 DIAGNOSIS — N451 Epididymitis: Secondary | ICD-10-CM | POA: Insufficient documentation

## 2016-08-18 DIAGNOSIS — N50812 Left testicular pain: Secondary | ICD-10-CM

## 2016-08-18 DIAGNOSIS — F17210 Nicotine dependence, cigarettes, uncomplicated: Secondary | ICD-10-CM | POA: Insufficient documentation

## 2016-08-18 LAB — BASIC METABOLIC PANEL
ANION GAP: 7 (ref 5–15)
BUN: 13 mg/dL (ref 6–20)
CO2: 25 mmol/L (ref 22–32)
CREATININE: 0.85 mg/dL (ref 0.61–1.24)
Calcium: 9.2 mg/dL (ref 8.9–10.3)
Chloride: 104 mmol/L (ref 101–111)
GFR calc Af Amer: >60 mL/min (ref >60)
GFR calc non Af Amer: >60 mL/min (ref >60)
GLUCOSE: 106 mg/dL — AB (ref 65–99)
Potassium: 3.9 mmol/L (ref 3.5–5.1)
Sodium: 136 mmol/L (ref 135–145)

## 2016-08-18 LAB — CBC WITH DIFFERENTIAL/PLATELET
BASOS ABS: 0.1 10*3/uL (ref 0–0.1)
Basophils Relative: 1 %
EOS PCT: 1 %
Eosinophils Absolute: 0.1 10*3/uL (ref 0–0.7)
HEMATOCRIT: 46.5 % (ref 40.0–52.0)
Hemoglobin: 15.8 g/dL (ref 13.0–18.0)
LYMPHS PCT: 12 %
Lymphs Abs: 2.1 10*3/uL (ref 1.0–3.6)
MCH: 34.8 pg — AB (ref 26.0–34.0)
MCHC: 33.9 g/dL (ref 32.0–36.0)
MCV: 102.4 fL — AB (ref 80.0–100.0)
MONO ABS: 1.1 10*3/uL — AB (ref 0.2–1.0)
MONOS PCT: 7 %
NEUTROS ABS: 13.5 10*3/uL — AB (ref 1.4–6.5)
Neutrophils Relative %: 79 %
PLATELETS: 248 10*3/uL (ref 150–440)
RBC: 4.54 MIL/uL (ref 4.40–5.90)
RDW: 15 % — ABNORMAL HIGH (ref 11.5–14.5)
WBC: 16.9 10*3/uL — ABNORMAL HIGH (ref 3.8–10.6)

## 2016-08-18 LAB — URINALYSIS, COMPLETE (UACMP) WITH MICROSCOPIC
BACTERIA UA: NONE SEEN
BILIRUBIN URINE: NEGATIVE
Glucose, UA: NEGATIVE mg/dL
Hgb urine dipstick: NEGATIVE
Ketones, ur: 5 mg/dL — AB
Nitrite: NEGATIVE
Protein, ur: 100 mg/dL — AB
SPECIFIC GRAVITY, URINE: 1.03 (ref 1.005–1.030)
Squamous Epithelial / LPF: NONE SEEN
pH: 5 (ref 5.0–8.0)

## 2016-08-18 LAB — CHLAMYDIA/NGC RT PCR (ARMC ONLY)
CHLAMYDIA TR: NOT DETECTED
N GONORRHOEAE: NOT DETECTED

## 2016-08-18 MED ORDER — LIDOCAINE HCL (PF) 1 % IJ SOLN
INTRAMUSCULAR | Status: AC
  Filled 2016-08-18: qty 5

## 2016-08-18 MED ORDER — CEFTRIAXONE SODIUM 250 MG IJ SOLR
250.0000 mg | Freq: Once | INTRAMUSCULAR | Status: AC
  Administered 2016-08-18: 250 mg via INTRAMUSCULAR
  Filled 2016-08-18: qty 250

## 2016-08-18 MED ORDER — KETOROLAC TROMETHAMINE 30 MG/ML IJ SOLN
60.0000 mg | Freq: Once | INTRAMUSCULAR | Status: AC
  Administered 2016-08-18: 60 mg via INTRAMUSCULAR
  Filled 2016-08-18: qty 2

## 2016-08-18 MED ORDER — AZITHROMYCIN 250 MG PO TABS
1000.0000 mg | ORAL_TABLET | Freq: Once | ORAL | Status: AC
  Administered 2016-08-18: 1000 mg via ORAL
  Filled 2016-08-18: qty 4

## 2016-08-18 NOTE — ED Provider Notes (Signed)
[facility] Emergency Department Provider Note  ____________________________________________  Time seen: Approximately 3:24 PM  I have reviewed the triage vital signs and the nursing notes.   HISTORY  Chief Complaint Testicle Pain and Back Pain   HPI HRIDAY STAI is a 50 y.o. male history of HIV (CD4 count of 940 and viral load 271 in 01/2016) who presents for evaluation of left testicular pain. Patient reports that he has been sexually active with one male partner. Has not used protection one time and noticed that she had a vaginal discharge. For the course of the last week he has had left-sided testicular pain that he describes as sharp/throbbing, constant, severe, worse when he touches or stands up. Also has had frequency and dysuria. He has also noticed discharge from his penis. He denies abdominal pain, fever, chills. He endorses compliance with his medications.  Past Medical History:  Diagnosis Date  . Cocaine abuse   . DJD (degenerative joint disease)    bilateral hips  . ETOH abuse   . HIV disease (HCC)   . Marijuana abuse     Patient Active Problem List   Diagnosis Date Noted  . Candidiasis 10/05/2013  . Avascular necrosis of bone of right hip (HCC) 09/24/2013  . Substance abuse 02/24/2013  . Gonorrhea 07/10/2012  . 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 Severe malnutrition (HCC) 07/09/2012  . Epididymitis, right 07/08/2012  . Leukocytosis 07/08/2012  . Contact dermatitis 10/21/2011  . Periodontal disease 10/21/2011  . Warts 10/10/2011  . Underweight 03/22/2011  . DENTAL CARIES 01/19/2009  . HIV DISEASE 12/05/2008  . ACUTE GINGIVITIS PLAQUE INDUCED 12/05/2008  . ASEPTIC NECROSIS, FEMUR HEAD/NECK 12/05/2008  . PERSONAL HISTORY OF TRAUMATIC BRAIN INJURY 12/05/2008    Past Surgical History:  Procedure Laterality Date  . CEREBRAL ANEURYSM REPAIR  1995  . 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 TOTAL HIP  ARTHROPLASTY Right 09/24/2013   Procedure: RIGHT TOTAL HIP ARTHROPLASTY;  Surgeon: Thera Flake., MD;  Location: [facility] OR;  Service: Orthopedics;  Laterality: Right;    Prior to Admission medications   Medication Sig Start Date End Date Taking? Authorizing Provider  emtricitabine-rilpivir-tenofovir DF (COMPLERA) 200-25-300 MG tablet TAKE 1 TABLET BY MOUTH DAILY, take with 500 calorie meal. #30 tabs. 11 refills 01/11/16   Judyann Munson, MD    Allergies Oxycodone; Doxycycline; Dilantin [phenytoin sodium extended]; Hydrocodone-acetaminophen; Levofloxacin; and Phenytoin  Family History  Problem Relation Age of Onset  . Hypertension Mother   . Hepatitis C Father     Social History Social History  Substance Use Topics  . Smoking status: Current Every Day Smoker    Packs/day: 0.50    Years: 22.00    Types: Cigarettes    Last attempt to quit: 04/28/2013  . Smokeless tobacco: Never Used  . Alcohol use No     Comment: not currently    Review of Systems  Constitutional: Negative for fever. Eyes: Negative for visual changes. ENT: Negative for sore throat. Neck: No neck pain  Cardiovascular: Negative for chest pain. Respiratory: Negative for shortness of breath. Gastrointestinal: Negative for abdominal pain, vomiting or diarrhea. Genitourinary: +dysuria, penile discharge, L testicular pain Musculoskeletal: Negative for back pain. Skin: Negative for rash. Neurological: Negative for headaches, weakness or numbness. Psych: No SI or HI  ____________________________________________   PHYSICAL EXAM:  VITAL SIGNS: ED Triage Vitals  Enc Vitals Group     BP --      Pulse Rate  08/18/16 1356 81     Resp 08/18/16 1356 16     Temp 08/18/16 1356 97.9 F (36.6 C)     Temp Source 08/18/16 1356 Oral     SpO2 08/18/16 1356 97 %     Weight 08/18/16 1355 168 lb (76.2 kg)     Height 08/18/16 1355 5\' 9"  (1.753 m)     Head Circumference --      Peak Flow --      Pain Score 08/18/16 1356 8      Pain Loc --      Pain Edu? --      Excl. in GC? --     Constitutional: Alert and oriented. Well appearing and in no apparent distress. HEENT:      Head: Normocephalic and atraumatic.         Eyes: Conjunctivae are normal. Sclera is non-icteric. EOMI. PERRL      Mouth/Throat: Mucous membranes are moist.       Neck: Supple with no signs of meningismus. Cardiovascular: Regular rate and rhythm. No murmurs, gallops, or rubs. 2+ symmetrical distal pulses are present in all extremities. No JVD. Respiratory: Normal respiratory effort. Lungs are clear to auscultation bilaterally. No wheezes, crackles, or rhonchi.  Gastrointestinal: Soft, non tender, and non distended with positive bowel sounds. No rebound or guarding. Genitourinary: No CVA tenderness. Bilateral testicles are descended, ttp over the L testicle diffusely, bilateral positive cremasteric reflexes are present, no swelling or erythema of the scrotum. No evidence of inguinal hernia. No penile discharge. Musculoskeletal: Nontender with normal range of motion in all extremities. No edema, cyanosis, or erythema of extremities. Neurologic: Normal speech and language. Face is symmetric. Moving all extremities. No gross focal neurologic deficits are appreciated. Skin: Skin is warm, dry and intact. No rash noted. Psychiatric: Mood and affect are normal. Speech and behavior are normal.  ____________________________________________   LABS (all labs ordered are listed, but only abnormal results are displayed)  Labs Reviewed  CBC WITH DIFFERENTIAL/PLATELET - Abnormal; Notable for the following:       Result Value   WBC 16.9 (*)    MCV 102.4 (*)    MCH 34.8 (*)    RDW 15.0 (*)    Neutro Abs 13.5 (*)    Monocytes Absolute 1.1 (*)    All other components within normal limits  BASIC METABOLIC PANEL - Abnormal; Notable for the following:    Glucose, Bld 106 (*)    All other components within normal limits  URINALYSIS, COMPLETE (UACMP) WITH  MICROSCOPIC - Abnormal; Notable for the following:    Color, Urine AMBER (*)    APPearance HAZY (*)    Ketones, ur 5 (*)    Protein, ur 100 (*)    Leukocytes, UA LARGE (*)    All other components within normal limits  URINE CULTURE  CHLAMYDIA/NGC RT PCR ([facility] ONLY)   ____________________________________________  EKG  none ____________________________________________  RADIOLOGY  US testicle:  1. The left epididymis is mildly heterogeneous and mildly hypervascular compared to the right, suspicious for epididymitis. 2. Small bilateral hydroceles. 3. Normal Doppler flow signal in both testicles. 4. Several small benign testicular calcifications observed, no surrounding mass, no follow up is felt to be necessary for these calcifications. ____________________________________________   PROCEDURES  Procedure(s) performed: None Procedures Critical Care performed:  None ____________________________________________   INITIAL IMPRESSION / ASSESSMENT AND PLAN / ED COURSE 50 y.o. male history of HIV (CD4 count of 940 and viral load 271 in 01/2016)  who presents for evaluation of left testicular pain. Ultrasound concerning for epididymitis and a urinalysis showing large leukocytes with too numerous to count WBCs but no bacteria. Presentation concerning for STD either gonorrhea or chlamydia. These tests are pending at this time. I discussed patient's presentation with [facility] M.D. on-call who agrees with management of 1 g by mouth azithromycin since patient has hives with doxycycline and ceftriaxone IM 250 mg. No further antibiotics since patient CD4 count was normal back in May 2017.  Clinical Course     Pertinent labs & imaging results that were available during my care of the patient were reviewed by me and considered in my medical decision making (see chart for details).    ____________________________________________   FINAL CLINICAL IMPRESSION(S) / ED  DIAGNOSES  Final diagnoses:  Left epididymitis  STD (male)      NEW MEDICATIONS STARTED DURING THIS VISIT:  New Prescriptions   No medications on file     Note:  This document was prepared using Dragon voice recognition software and may include unintentional dictation errors.    Nita Sicklearolina Corbitt Cloke, MD 08/18/16 [phone]

## 2016-08-18 NOTE — ED Notes (Signed)
Patient returned from Ultrasound. 

## 2016-08-18 NOTE — Discharge Instructions (Signed)
Your partners need to be treated otherwise once you resume sexual activity with them you will be reinfected.

## 2016-08-18 NOTE — ED Triage Notes (Addendum)
Possible exposure exposure to STD.  C/O testicular soreness, itching and low back pain x 1 week. Also c/o urinary frequency.

## 2016-08-18 NOTE — ED Notes (Signed)
Patient transported to Ultrasound 

## 2016-08-19 LAB — URINE CULTURE: Culture: NO GROWTH

## 2016-12-04 ENCOUNTER — Other Ambulatory Visit: Payer: Self-pay | Admitting: *Deleted

## 2016-12-04 DIAGNOSIS — B20 Human immunodeficiency virus [HIV] disease: Secondary | ICD-10-CM

## 2016-12-04 MED ORDER — EMTRICITAB-RILPIVIR-TENOFOV DF 200-25-300 MG PO TABS: ORAL_TABLET | ORAL | 1 refills | Status: DC

## 2017-01-28 ENCOUNTER — Other Ambulatory Visit: Payer: Self-pay | Admitting: *Deleted

## 2017-01-28 DIAGNOSIS — B20 Human immunodeficiency virus [HIV] disease: Secondary | ICD-10-CM

## 2017-01-28 MED ORDER — EMTRICITAB-RILPIVIR-TENOFOV DF 200-25-300 MG PO TABS: ORAL_TABLET | ORAL | 0 refills | Status: DC

## 2017-02-13 ENCOUNTER — Ambulatory Visit: Payer: Self-pay | Admitting: Internal Medicine

## 2017-02-28 ENCOUNTER — Other Ambulatory Visit: Payer: Self-pay | Admitting: *Deleted

## 2017-02-28 ENCOUNTER — Encounter: Payer: Self-pay | Admitting: *Deleted

## 2017-02-28 DIAGNOSIS — B20 Human immunodeficiency virus [HIV] disease: Secondary | ICD-10-CM

## 2017-02-28 MED ORDER — EMTRICITAB-RILPIVIR-TENOFOV DF 200-25-300 MG PO TABS: ORAL_TABLET | ORAL | 0 refills | Status: DC

## 2017-02-28 NOTE — Telephone Encounter (Addendum)
Patient needs Pharmacist appt ASAP.  Patient sent letter to call for appt ASAP with Pharmacist.

## 2017-03-26 ENCOUNTER — Other Ambulatory Visit: Payer: Self-pay | Admitting: Internal Medicine

## 2017-03-26 DIAGNOSIS — B20 Human immunodeficiency virus [HIV] disease: Secondary | ICD-10-CM

## 2017-03-31 ENCOUNTER — Other Ambulatory Visit: Payer: Self-pay | Admitting: Internal Medicine

## 2017-03-31 DIAGNOSIS — B20 Human immunodeficiency virus [HIV] disease: Secondary | ICD-10-CM

## 2017-03-31 NOTE — Telephone Encounter (Signed)
[facility].  No Showed for last appt with Dr. Drue SecondSnider on 02/14/17.  Last know telephone # [facility].  [facility] about the patient's current status.  Will refill medication when additional information is known.

## 2017-04-01 ENCOUNTER — Telehealth: Payer: Self-pay | Admitting: *Deleted

## 2017-04-01 DIAGNOSIS — B20 Human immunodeficiency virus [HIV] disease: Secondary | ICD-10-CM

## 2017-04-01 NOTE — Telephone Encounter (Signed)
Unable to leave message, no voice mail.  Unable to contact patient to schedule appt with [facility] pharmacist.

## 2017-04-02 ENCOUNTER — Other Ambulatory Visit: Payer: Self-pay | Admitting: *Deleted

## 2017-04-02 DIAGNOSIS — B20 Human immunodeficiency virus [HIV] disease: Secondary | ICD-10-CM

## 2017-04-02 MED ORDER — EMTRICITAB-RILPIVIR-TENOFOV DF 200-25-300 MG PO TABS: ORAL_TABLET | ORAL | 0 refills | Status: DC

## 2017-04-02 NOTE — Telephone Encounter (Addendum)
Spoke with emergency contact, patient's mother. RN advised that I would give a 30 day refill, but Michael [name] needs to call his doctor's office to be seen as soon as possible. She stated that he has lost his phone, but she will go to his home and let him know. Per mother, patient is not incarcerated.  Spoke with pharmacy. Recent fill history is 5/11, 6/8, 7/6. He is in need of new prescription, as his previous one expired. 1 month refill sent to [facility]. They will also contact the patient and advise him to make an appointment for future refills. Michael [name], Michael Housel M, RN

## 2017-04-02 NOTE — Addendum Note (Signed)
Addended by: Andree CossHOWELL, Clemens Lachman M on: 04/02/2017 11:54 AM   Modules accepted: Orders

## 2017-04-23 ENCOUNTER — Other Ambulatory Visit: Payer: Self-pay | Admitting: Internal Medicine

## 2017-04-23 DIAGNOSIS — B20 Human immunodeficiency virus [HIV] disease: Secondary | ICD-10-CM

## 2017-05-22 ENCOUNTER — Encounter: Payer: Self-pay | Admitting: Internal Medicine

## 2017-05-22 ENCOUNTER — Ambulatory Visit (INDEPENDENT_AMBULATORY_CARE_PROVIDER_SITE_OTHER): Payer: Medicare Other | Admitting: Internal Medicine

## 2017-05-22 ENCOUNTER — Other Ambulatory Visit (HOSPITAL_COMMUNITY)
Admission: RE | Admit: 2017-05-22 | Discharge: 2017-05-22 | Disposition: A | Discharge status: Home / Self Care | Payer: Medicare Other | Source: Ambulatory Visit | Attending: Internal Medicine | Admitting: Internal Medicine

## 2017-05-22 VITALS — BP 107/73 | HR 87 | Temp 98.3°F | Wt 166.0 lb

## 2017-05-22 DIAGNOSIS — B20 Human immunodeficiency virus [HIV] disease: Secondary | ICD-10-CM | POA: Insufficient documentation

## 2017-05-22 DIAGNOSIS — Z23 Encounter for immunization: Secondary | ICD-10-CM | POA: Diagnosis not present

## 2017-05-22 MED ORDER — MEGESTROL ACETATE 40 MG PO TABS: 40.0000 mg | ORAL_TABLET | Freq: Every day | ORAL | 11 refills | Status: DC

## 2017-05-22 MED ORDER — DARUNAVIR ETHANOLATE 800 MG PO TABS: 800.0000 mg | ORAL_TABLET | Freq: Every day | ORAL | 11 refills | Status: DC

## 2017-05-22 MED ORDER — ELVITEG-COBIC-EMTRICIT-TENOFAF 150-150-200-10 MG PO TABS: 1.0000 | ORAL_TABLET | Freq: Every day | ORAL | 11 refills | Status: DC

## 2017-05-22 NOTE — Progress Notes (Signed)
RFV: re-establishing care for hiv disease  Patient ID: Michael [name], male   DOB: 09/18/1965, 51 y.o.   MRN: [id number]  HPI  51yo M with hiv disease, previously on complera, but unclear how long he has been off of ART. He denies any b symptoms but of late, has had loss of appetite, and associated weight loss of #11 in the last month. He denies any fever, chills, nightsweats or cough. He state he has been sentenced for the next 10 months. He is here from the [facility] detention [facility]  Outpatient Encounter Prescriptions as of 05/22/2017  Medication Sig  . emtricitabine-rilpivir-tenofovir DF (COMPLERA) 200-25-300 MG tablet TAKE 1 TABLET BY MOUTH DAILY, take with 500 calorie meal. Please call for an appointment   No facility-administered encounter medications on file as of 05/22/2017.      Patient Active Problem List   Diagnosis Date Noted  . Candidiasis 10/05/2013  . Avascular necrosis of bone of right hip (HCC) 09/24/2013  . Substance abuse 02/24/2013  . Gonorrhea 07/10/2012  . 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 Severe malnutrition (HCC) 07/09/2012  . Epididymitis, right 07/08/2012  . Leukocytosis 07/08/2012  . Contact dermatitis 10/21/2011  . Periodontal disease 10/21/2011  . Warts 10/10/2011  . Underweight 03/22/2011  . DENTAL CARIES 01/19/2009  . HIV DISEASE 12/05/2008  . ACUTE GINGIVITIS PLAQUE INDUCED 12/05/2008  . ASEPTIC NECROSIS, FEMUR HEAD/NECK 12/05/2008  . PERSONAL HISTORY OF TRAUMATIC BRAIN INJURY 12/05/2008     Health Maintenance Due  Topic Date Due  . INFLUENZA VACCINE  04/09/2017    Social History  Substance Use Topics  . Smoking status: Current Every Day Smoker    Packs/day: 0.50    Years: 22.00    Types: Cigarettes    Last attempt to quit: 04/28/2013  . Smokeless tobacco: Never Used  . Alcohol use No     Comment: not currently  family history includes Hepatitis C in his father; Hypertension in his mother. Review of Systems +  weight loss, loss of appetite. Otherwise 12 point ros is negative Physical Exam   BP 107/73   Pulse 87   Temp 98.3 F (36.8 C)   Wt 166 lb (75.3 kg)   BMI 24.51 kg/m   Physical Exam  Constitutional: He is oriented to person, place, and time. He appears well-developed and well-nourished. No distress.  HENT:  Mouth/Throat: Oropharynx is clear and moist. No oropharyngeal exudate.  Cardiovascular: Normal rate, regular rhythm and normal heart sounds. Exam reveals no gallop and no friction rub.  No murmur heard.  Pulmonary/Chest: Effort normal and breath sounds normal. No respiratory distress. He has no wheezes.  Abdominal: Soft. Bowel sounds are normal. He exhibits no distension. There is no tenderness.  Lymphadenopathy:  He has no cervical adenopathy.  Neurological: He is alert and oriented to person, place, and time.  Skin: Skin is warm and dry. No rash noted. No erythema.  Psychiatric: He has a normal mood and affect. His behavior is normal.    Lab Results  Component Value Date   CD4TCELL 31 (L) 01/11/2016   Lab Results  Component Value Date   CD4TABS 940 01/11/2016   CD4TABS 920 10/06/2014   CD4TABS 1,020 08/26/2013   Lab Results  Component Value Date   HIV1RNAQUANT 271 (H) 01/11/2016   Lab Results  Component Value Date   HEPBSAB NEG 01/11/2016   Lab Results  Component Value Date   LABRPR NON REAC 01/11/2016    CBC Lab Results  Component Value Date   WBC 16.9 (H) 08/18/2016   RBC 4.54 08/18/2016   HGB 15.8 08/18/2016   HCT 46.5 08/18/2016   PLT 248 08/18/2016   MCV 102.4 (H) 08/18/2016   MCH 34.8 (H) 08/18/2016   MCHC 33.9 08/18/2016   RDW 15.0 (H) 08/18/2016   LYMPHSABS 2.1 08/18/2016   MONOABS 1.1 (H) 08/18/2016   EOSABS 0.1 08/18/2016    BMET Lab Results  Component Value Date   NA 136 08/18/2016   K 3.9 08/18/2016   CL 104 08/18/2016   CO2 25 08/18/2016   GLUCOSE 106 (H) 08/18/2016   BUN 13 08/18/2016   CREATININE 0.85 08/18/2016   CALCIUM  9.2 08/18/2016   GFRNONAA >60 08/18/2016   GFRAA >60 08/18/2016      Assessment and Plan  hiv disease = will check viral load with genotype to see if he has gained any mutations. Previously had wildtype and was on complera. We will place him on genvoya plus prezista. Will check baseline labs  Health maintenance = will have him get flu shot and hep A #1 today, he is hep B immune.   Loss of appetite/weight loss = likely due to uncontrolled HIV disease. Will start on megace 40mg  daily  rtc in 4- 6 wk

## 2017-05-23 LAB — T-HELPER CELL (CD4) - (RCID CLINIC ONLY)
CD4 % Helper T Cell: 32 % — ABNORMAL LOW (ref 33–55)
CD4 T Cell Abs: 630 /uL (ref 400–2700)

## 2017-05-26 LAB — URINE CYTOLOGY ANCILLARY ONLY
CHLAMYDIA, DNA PROBE: NEGATIVE
Neisseria Gonorrhea: NEGATIVE

## 2017-05-26 LAB — CYTOLOGY, (ORAL, ANAL, URETHRAL) ANCILLARY ONLY
Chlamydia: NEGATIVE
Neisseria Gonorrhea: NEGATIVE

## 2017-05-27 LAB — COMPLETE METABOLIC PANEL WITH GFR
AG RATIO: 1.4 (calc) (ref 1.0–2.5)
ALBUMIN MSPROF: 4 g/dL (ref 3.6–5.1)
ALT: 24 U/L (ref 9–46)
AST: 15 U/L (ref 10–40)
Alkaline phosphatase (APISO): 77 U/L (ref 40–115)
BUN: 18 mg/dL (ref 7–25)
CALCIUM: 9.6 mg/dL (ref 8.6–10.3)
CO2: 26 mmol/L (ref 20–32)
Chloride: 97 mmol/L — ABNORMAL LOW (ref 98–110)
Creat: 1.12 mg/dL (ref 0.60–1.35)
GFR, EST AFRICAN AMERICAN: 90 mL/min/{1.73_m2} (ref >=60)
GFR, EST NON AFRICAN AMERICAN: 77 mL/min/{1.73_m2} (ref >=60)
Globulin: 2.9 g/dL (calc) (ref 1.9–3.7)
Glucose, Bld: 91 mg/dL (ref 65–99)
POTASSIUM: 5 mmol/L (ref 3.5–5.3)
Sodium: 132 mmol/L — ABNORMAL LOW (ref 135–146)
TOTAL PROTEIN: 6.9 g/dL (ref 6.1–8.1)
Total Bilirubin: 0.3 mg/dL (ref 0.2–1.2)

## 2017-05-27 LAB — CBC WITH DIFFERENTIAL/PLATELET
BASOS PCT: 0.6 %
Basophils Absolute: 32 cells/uL (ref 0–200)
Eosinophils Absolute: 59 cells/uL (ref 15–500)
Eosinophils Relative: 1.1 %
HEMATOCRIT: 45.5 % (ref 38.5–50.0)
Hemoglobin: 15.9 g/dL (ref 13.2–17.1)
LYMPHS ABS: 2014 {cells}/uL (ref 850–3900)
MCH: 33.9 pg — ABNORMAL HIGH (ref 27.0–33.0)
MCHC: 34.9 g/dL (ref 32.0–36.0)
MCV: 97 fL (ref 80.0–100.0)
MPV: 10.2 fL (ref 7.5–12.5)
Monocytes Relative: 6.6 %
NEUTROS PCT: 54.4 %
Neutro Abs: 2938 cells/uL (ref 1500–7800)
Platelets: 272 10*3/uL (ref 140–400)
RBC: 4.69 10*6/uL (ref 4.20–5.80)
RDW: 13.6 % (ref 11.0–15.0)
Total Lymphocyte: 37.3 %
WBC: 5.4 10*3/uL (ref 3.8–10.8)
WBCMIX: 356 {cells}/uL (ref 200–950)

## 2017-05-27 LAB — HLA B*5701: HLA-B*5701 w/rflx HLA-B High: NEGATIVE

## 2017-05-27 LAB — RPR: RPR: NONREACTIVE

## 2017-05-30 LAB — HIV-1 GENOTYPE: HIV-1 GENOTYPE: DETECTED — AB

## 2017-05-30 LAB — HIV-1 RNA ULTRAQUANT REFLEX TO GENTYP+
HIV 1 RNA QUANT: 15700 {copies}/mL — AB
HIV-1 RNA QUANT, LOG: 4.2 {Log_copies}/mL — AB

## 2018-02-10 ENCOUNTER — Ambulatory Visit: Payer: Self-pay | Admitting: Internal Medicine

## 2018-03-02 ENCOUNTER — Telehealth: Payer: Self-pay | Admitting: *Deleted

## 2018-03-02 DIAGNOSIS — B20 Human immunodeficiency virus [HIV] disease: Secondary | ICD-10-CM

## 2018-03-02 MED ORDER — ELVITEG-COBIC-EMTRICIT-TENOFAF 150-150-200-10 MG PO TABS: 1.0000 | ORAL_TABLET | Freq: Every day | ORAL | 3 refills | Status: DC

## 2018-03-02 MED ORDER — DARUNAVIR ETHANOLATE 800 MG PO TABS: 800.0000 mg | ORAL_TABLET | Freq: Every day | ORAL | 3 refills | Status: DC

## 2018-03-02 NOTE — Telephone Encounter (Signed)
[facility] is [facility]. He came in 5/26 with a bottle each of Prezista/Genvoya and is in need of refills. [facility] [place], scheduled patient for follow up 7/24 with Dr Drue SecondSnider. Andree CossHowell, Sofia Vanmeter M, RN

## 2018-04-01 ENCOUNTER — Ambulatory Visit: Payer: Self-pay | Admitting: Internal Medicine

## 2018-04-06 ENCOUNTER — Ambulatory Visit

## 2018-04-06 ENCOUNTER — Ambulatory Visit (INDEPENDENT_AMBULATORY_CARE_PROVIDER_SITE_OTHER): Admitting: Internal Medicine

## 2018-04-06 ENCOUNTER — Encounter: Payer: Self-pay | Admitting: Internal Medicine

## 2018-04-06 ENCOUNTER — Other Ambulatory Visit (HOSPITAL_COMMUNITY)
Admission: RE | Admit: 2018-04-06 | Discharge: 2018-04-06 | Disposition: A | Discharge status: Home / Self Care | Source: Ambulatory Visit | Attending: Internal Medicine | Admitting: Internal Medicine

## 2018-04-06 VITALS — BP 113/76 | HR 68 | Temp 97.7°F | Ht 69.0 in | Wt 221.0 lb

## 2018-04-06 DIAGNOSIS — Z23 Encounter for immunization: Secondary | ICD-10-CM | POA: Diagnosis not present

## 2018-04-06 DIAGNOSIS — B20 Human immunodeficiency virus [HIV] disease: Secondary | ICD-10-CM

## 2018-04-06 DIAGNOSIS — A630 Anogenital (venereal) warts: Secondary | ICD-10-CM

## 2018-04-06 MED ORDER — ELVITEG-COBIC-EMTRICIT-TENOFAF 150-150-200-10 MG PO TABS: 1.0000 | ORAL_TABLET | Freq: Every day | ORAL | 3 refills | Status: DC

## 2018-04-06 MED ORDER — DARUNAVIR ETHANOLATE 800 MG PO TABS: 800.0000 mg | ORAL_TABLET | Freq: Every day | ORAL | 3 refills | Status: DC

## 2018-04-06 NOTE — Progress Notes (Signed)
RFV: follow up for hiv disease  Patient ID: Michael BureauRobert L Waid, male   DOB: 1965/12/22, 52 y.o.   MRN: [id number]  HPI Molly MaduroRobert is a 52yo M with hiv disease, CD4 count of genova-DRV, CD 4 count of 630/15,000(in sep 2018) he spent most of this time in incarceration and states that he has not had his medications for roughly 5-6 wk for unclear reasons. It appears he may have lost his prior insurance coverage. He comes to clinic for evaluation and to sign up for adap. He states he was undetectable in April when he last had his labs drawn. No sexual activity. He states that he also had anal lesions removed in April. Path did not show malignancy per his report  Sochx: no sexually active. No illicit drug use  Outpatient Encounter Medications as of 04/06/2018  Medication Sig  . darunavir (PREZISTA) 800 MG tablet Take 1 tablet (800 mg total) by mouth daily. (Patient not taking: Reported on 04/06/2018)  . elvitegravir-cobicistat-emtricitabine-tenofovir (GENVOYA) 150-150-200-10 MG TABS tablet Take 1 tablet by mouth daily with breakfast. (Patient not taking: Reported on 04/06/2018)  . megestrol (MEGACE) 40 MG tablet Take 1 tablet (40 mg total) by mouth daily. (Patient not taking: Reported on 04/06/2018)   No facility-administered encounter medications on file as of 04/06/2018.      Patient Active Problem List   Diagnosis Date Noted  . Candidiasis 10/05/2013  . Avascular necrosis of bone of right hip (HCC) 09/24/2013  . Substance abuse (HCC) 02/24/2013  . Gonorrhea 07/10/2012  . 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 Severe malnutrition (HCC) 07/09/2012  . Epididymitis, right 07/08/2012  . Leukocytosis 07/08/2012  . Contact dermatitis 10/21/2011  . Periodontal disease 10/21/2011  . Warts 10/10/2011  . Underweight 03/22/2011  . DENTAL CARIES 01/19/2009  . HIV DISEASE 12/05/2008  . ACUTE GINGIVITIS PLAQUE INDUCED 12/05/2008  . ASEPTIC NECROSIS, FEMUR HEAD/NECK 12/05/2008  . PERSONAL HISTORY OF  TRAUMATIC BRAIN INJURY 12/05/2008     There are no preventive care reminders to display for this patient.   Review of Systems Review of Systems  Constitutional: Negative for fever, chills, diaphoresis, activity change, appetite change, fatigue and unexpected weight change.  HENT: Negative for congestion, sore throat, rhinorrhea, sneezing, trouble swallowing and sinus pressure.  Eyes: Negative for photophobia and visual disturbance.  Respiratory: Negative for cough, chest tightness, shortness of breath, wheezing and stridor.  Cardiovascular: Negative for chest pain, palpitations and leg swelling.  Gastrointestinal: Negative for nausea, vomiting, abdominal pain, diarrhea, constipation, blood in stool, abdominal distention and anal bleeding.  Genitourinary: Negative for dysuria, hematuria, flank pain and difficulty urinating.  Musculoskeletal: Negative for myalgias, back pain, joint swelling, arthralgias and gait problem.  Skin: Negative for color change, pallor, rash and wound.  Neurological: Negative for dizziness, tremors, weakness and light-headedness.  Hematological: Negative for adenopathy. Does not bruise/bleed easily.  Psychiatric/Behavioral: Negative for behavioral problems, confusion, sleep disturbance, dysphoric mood, decreased concentration and agitation.    Physical Exam   BP 113/76   Pulse 68   Temp 97.7 F (36.5 C)   Ht 5\' 9"  (1.753 m)   Wt 221 lb (100.2 kg)   BMI 32.64 kg/m   Physical Exam  Constitutional: He is oriented to person, place, and time. He appears well-developed and well-nourished. No distress.  HENT:  Mouth/Throat: Oropharynx is clear and moist. No oropharyngeal exudate.  Cardiovascular: Normal rate, regular rhythm and normal heart sounds. Exam reveals no gallop and no friction rub.  No murmur heard.  Pulmonary/Chest:  Effort normal and breath sounds normal. No respiratory distress. He has no wheezes.  Abdominal: Soft. Bowel sounds are normal. He  exhibits no distension. There is no tenderness.  Lymphadenopathy:  He has no cervical adenopathy.  Neurological: He is alert and oriented to person, place, and time.  Skin: Skin is warm and dry. No rash noted. No erythema.  Psychiatric: He has a normal mood and affect. His behavior is normal.    Lab Results  Component Value Date   CD4TCELL 32 (L) 05/22/2017   Lab Results  Component Value Date   CD4TABS 630 05/22/2017   CD4TABS 940 01/11/2016   CD4TABS 920 10/06/2014   Lab Results  Component Value Date   HIV1RNAQUANT 15,700 (H) 05/22/2017   Lab Results  Component Value Date   HEPBSAB NEG 01/11/2016   Lab Results  Component Value Date   LABRPR NON-REACTIVE 05/22/2017    CBC Lab Results  Component Value Date   WBC 5.4 05/22/2017   RBC 4.69 05/22/2017   HGB 15.9 05/22/2017   HCT 45.5 05/22/2017   PLT 272 05/22/2017   MCV 97.0 05/22/2017   MCH 33.9 (H) 05/22/2017   MCHC 34.9 05/22/2017   RDW 13.6 05/22/2017   LYMPHSABS 2,014 05/22/2017   MONOABS 1.1 (H) 08/18/2016   EOSABS 59 05/22/2017    BMET Lab Results  Component Value Date   NA 132 (L) 05/22/2017   K 5.0 05/22/2017   CL 97 (L) 05/22/2017   CO2 26 05/22/2017   GLUCOSE 91 05/22/2017   BUN 18 05/22/2017   CREATININE 1.12 05/22/2017   CALCIUM 9.6 05/22/2017   GFRNONAA 77 05/22/2017   GFRAA 90 05/22/2017      Assessment and Plan  Health maintenance = will give prevnar today   HIV disease= will restart on genvoya and darunavir. Also will do testing. Gave sample of tivicay plus symtuza to bridge until insurance is back in play.  Hx of anal lesions = he reports he had surgery in April which was removed. No hx of cancer per patient- but we have no documentation

## 2018-04-07 LAB — CBC WITH DIFFERENTIAL/PLATELET
BASOS ABS: 18 {cells}/uL (ref 0–200)
Basophils Relative: 0.3 %
EOS ABS: 59 {cells}/uL (ref 15–500)
EOS PCT: 1 %
HCT: 43.6 % (ref 38.5–50.0)
Hemoglobin: 15.1 g/dL (ref 13.2–17.1)
Lymphs Abs: 2124 cells/uL (ref 850–3900)
MCH: 31.7 pg (ref 27.0–33.0)
MCHC: 34.6 g/dL (ref 32.0–36.0)
MCV: 91.4 fL (ref 80.0–100.0)
MONOS PCT: 6.9 %
MPV: 10.9 fL (ref 7.5–12.5)
NEUTROS ABS: 3292 {cells}/uL (ref 1500–7800)
NEUTROS PCT: 55.8 %
PLATELETS: 263 10*3/uL (ref 140–400)
RBC: 4.77 10*6/uL (ref 4.20–5.80)
RDW: 14.4 % (ref 11.0–15.0)
TOTAL LYMPHOCYTE: 36 %
WBC mixed population: 407 cells/uL (ref 200–950)
WBC: 5.9 10*3/uL (ref 3.8–10.8)

## 2018-04-07 LAB — COMPLETE METABOLIC PANEL WITH GFR
AG Ratio: 1.4 (calc) (ref 1.0–2.5)
ALBUMIN MSPROF: 4.1 g/dL (ref 3.6–5.1)
ALKALINE PHOSPHATASE (APISO): 104 U/L (ref 40–115)
ALT: 128 U/L — AB (ref 9–46)
AST: 81 U/L — AB (ref 10–40)
BILIRUBIN TOTAL: 0.2 mg/dL (ref 0.2–1.2)
BUN: 15 mg/dL (ref 7–25)
CHLORIDE: 102 mmol/L (ref 98–110)
CO2: 28 mmol/L (ref 20–32)
CREATININE: 0.87 mg/dL (ref 0.60–1.35)
Calcium: 9.6 mg/dL (ref 8.6–10.3)
GFR, Est African American: 117 mL/min/{1.73_m2} (ref >=60)
GFR, Est Non African American: 101 mL/min/{1.73_m2} (ref >=60)
GLUCOSE: 118 mg/dL — AB (ref 65–99)
Globulin: 3 g/dL (calc) (ref 1.9–3.7)
Potassium: 4.1 mmol/L (ref 3.5–5.3)
Sodium: 138 mmol/L (ref 135–146)
Total Protein: 7.1 g/dL (ref 6.1–8.1)

## 2018-04-07 LAB — T-HELPER CELL (CD4) - (RCID CLINIC ONLY)
CD4 T CELL HELPER: 31 % — AB (ref 33–55)
CD4 T Cell Abs: 640 /uL (ref 400–2700)

## 2018-04-07 LAB — RPR: RPR Ser Ql: NONREACTIVE

## 2018-04-08 LAB — URINE CYTOLOGY ANCILLARY ONLY
Chlamydia: NEGATIVE
Neisseria Gonorrhea: NEGATIVE

## 2018-04-17 LAB — HIV-1 GENOTYPE: HIV-1 Genotype: DETECTED — AB

## 2018-04-17 LAB — HIV-1 RNA ULTRAQUANT REFLEX TO GENTYP+
HIV 1 RNA QUANT: 8700 {copies}/mL — AB
HIV-1 RNA Quant, Log: 3.94 Log copies/mL — ABNORMAL HIGH

## 2018-08-10 ENCOUNTER — Ambulatory Visit: Payer: Self-pay | Admitting: Internal Medicine

## 2019-02-09 ENCOUNTER — Ambulatory Visit: Payer: Self-pay

## 2019-02-09 ENCOUNTER — Ambulatory Visit: Payer: Self-pay | Admitting: Internal Medicine

## 2019-03-11 ENCOUNTER — Encounter: Payer: Self-pay | Admitting: Internal Medicine

## 2019-03-11 ENCOUNTER — Ambulatory Visit: Payer: Self-pay

## 2019-03-11 ENCOUNTER — Ambulatory Visit (INDEPENDENT_AMBULATORY_CARE_PROVIDER_SITE_OTHER): Payer: Self-pay | Admitting: Internal Medicine

## 2019-03-11 ENCOUNTER — Other Ambulatory Visit: Payer: Self-pay

## 2019-03-11 ENCOUNTER — Ambulatory Visit: Payer: Self-pay | Admitting: Internal Medicine

## 2019-03-11 VITALS — BP 134/79 | HR 73 | Temp 97.8°F | Wt 200.0 lb

## 2019-03-11 DIAGNOSIS — B20 Human immunodeficiency virus [HIV] disease: Secondary | ICD-10-CM

## 2019-03-11 DIAGNOSIS — Z23 Encounter for immunization: Secondary | ICD-10-CM

## 2019-03-11 MED ORDER — ELVITEG-COBIC-EMTRICIT-TENOFAF 150-150-200-10 MG PO TABS: 1.0000 | ORAL_TABLET | Freq: Every day | ORAL | 11 refills | Status: DC

## 2019-03-11 MED ORDER — DARUNAVIR ETHANOLATE 800 MG PO TABS: 800.0000 mg | ORAL_TABLET | Freq: Every day | ORAL | 11 refills | Status: DC

## 2019-03-11 NOTE — Progress Notes (Signed)
RFV: follow up for hiv disease  Patient ID: Michael [name], male   DOB: 1965/11/03, 53 y.o.   MRN: [id number]  HPI [name] is a 54yo M with hiv disease, CD 4 count and VL not done since a year ago. On genvoya-DRV.he has been released from prison. I have been since may 21st. Living in shelter but also sees his son who is with his mother/grandmother. Has been working with Education officer, museum at shelter for housing.  Starts getting disability in august  Outpatient Encounter Medications as of 03/11/2019  Medication Sig  . darunavir (PREZISTA) 800 MG tablet Take 1 tablet (800 mg total) by mouth daily.  Marland Kitchen elvitegravir-cobicistat-emtricitabine-tenofovir (GENVOYA) 150-150-200-10 MG TABS tablet Take 1 tablet by mouth daily with breakfast.  . megestrol (MEGACE) 40 MG tablet Take 1 tablet (40 mg total) by mouth daily. (Patient not taking: Reported on 04/06/2018)   No facility-administered encounter medications on file as of 03/11/2019.      Patient Active Problem List   Diagnosis Date Noted  . Candidiasis 10/05/2013  . Avascular necrosis of bone of right hip ([place]) 09/24/2013  . Substance abuse ([place]) 02/24/2013  . Gonorrhea 07/10/2012  . 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 Severe malnutrition ([place]) 07/09/2012  . Epididymitis, right 07/08/2012  . Leukocytosis 07/08/2012  . Contact dermatitis 10/21/2011  . Periodontal disease 10/21/2011  . Warts 10/10/2011  . Underweight 03/22/2011  . DENTAL CARIES 01/19/2009  . HIV DISEASE 12/05/2008  . ACUTE GINGIVITIS PLAQUE INDUCED 12/05/2008  . ASEPTIC NECROSIS, FEMUR HEAD/NECK 12/05/2008  . PERSONAL HISTORY OF TRAUMATIC BRAIN INJURY 12/05/2008     Health Maintenance Due  Topic Date Due  . COLONOSCOPY  07/20/2018    Social History   Tobacco Use  . Smoking status: Current Every Day Smoker    Packs/day: 0.50    Years: 22.00    Pack years: 11.00    Types: Cigarettes    Last attempt to quit: 04/28/2013    Years since quitting: 5.8  .  Smokeless tobacco: Never Used  Substance Use Topics  . Alcohol use: No    Alcohol/week: 0.0 standard drinks    Comment: not currently  . Drug use: No    Frequency: 7.0 times per week    Types: Marijuana, Cocaine    Comment: nothing since his [birth year] in September 08 2011   Review of Systems Constitutional: Negative for fever, chills, diaphoresis, activity change, appetite change, fatigue and unexpected weight change.  HENT: Negative for congestion, sore throat, rhinorrhea, sneezing, trouble swallowing and sinus pressure.  Eyes: Negative for photophobia and visual disturbance.  Respiratory: Negative for cough, chest tightness, shortness of breath, wheezing and stridor.  Cardiovascular: Negative for chest pain, palpitations and leg swelling.  Gastrointestinal: Negative for nausea, vomiting, abdominal pain, diarrhea, constipation, blood in stool, abdominal distention and anal bleeding.  Genitourinary: Negative for dysuria, hematuria, flank pain and difficulty urinating.  Musculoskeletal: Negative for myalgias, back pain, joint swelling, arthralgias and gait problem.  Skin: Negative for color change, pallor, rash and wound.  Neurological: Negative for dizziness, tremors, weakness and light-headedness.  Hematological: Negative for adenopathy. Does not bruise/bleed easily.  Psychiatric/Behavioral: Negative for behavioral problems, confusion, sleep disturbance, dysphoric mood, decreased concentration and agitation.    Physical Exam   BP 134/79   Pulse 73   Temp 97.8 F (36.6 C)   Wt 200 lb (90.7 kg)   BMI 29.53 kg/m   Physical Exam  Constitutional: He is oriented to person, place, and time. He appears  well-developed and well-nourished. No distress.  HENT:  Mouth/Throat: Oropharynx is clear and moist. No oropharyngeal exudate.  Cardiovascular: Normal rate, regular rhythm and normal heart sounds. Exam reveals no gallop and no friction rub.  No murmur heard.  Pulmonary/Chest: Effort  normal and breath sounds normal. No respiratory distress. He has no wheezes.  Abdominal: Soft. Bowel sounds are normal. He exhibits no distension. There is no tenderness.  Lymphadenopathy:  He has no cervical adenopathy.  Neurological: He is alert and oriented to person, place, and time.  Skin: Skin is warm and dry. No rash noted. No erythema.  Psychiatric: He has a normal mood and affect. His behavior is normal.    Lab Results  Component Value Date   CD4TCELL 31 (L) 04/06/2018   Lab Results  Component Value Date   CD4TABS 640 04/06/2018   CD4TABS 630 05/22/2017   CD4TABS 940 01/11/2016   Lab Results  Component Value Date   HIV1RNAQUANT 8,700 (H) 04/06/2018   Lab Results  Component Value Date   HEPBSAB NEG 01/11/2016   Lab Results  Component Value Date   LABRPR NON-REACTIVE 04/06/2018    CBC Lab Results  Component Value Date   WBC 5.9 04/06/2018   RBC 4.77 04/06/2018   HGB 15.1 04/06/2018   HCT 43.6 04/06/2018   PLT 263 04/06/2018   MCV 91.4 04/06/2018   MCH 31.7 04/06/2018   MCHC 34.6 04/06/2018   RDW 14.4 04/06/2018   LYMPHSABS 2,124 04/06/2018   MONOABS 1.1 (H) 08/18/2016   EOSABS 59 04/06/2018    BMET Lab Results  Component Value Date   NA 138 04/06/2018   K 4.1 04/06/2018   CL 102 04/06/2018   CO2 28 04/06/2018   GLUCOSE 118 (H) 04/06/2018   BUN 15 04/06/2018   CREATININE 0.87 04/06/2018   CALCIUM 9.6 04/06/2018   GFRNONAA 101 04/06/2018   GFRAA 117 04/06/2018      Assessment and Plan  hiv disease = taking doses daily- has excellent adherence. Will refill genvoya-DRV. We will get lab work today.   Long term medication monitoring = kidney function is stable, will continue on current regimen  Health promotion = Meningitis vaccine today  Will apply hmap

## 2019-03-16 ENCOUNTER — Ambulatory Visit: Payer: Self-pay

## 2019-03-16 ENCOUNTER — Other Ambulatory Visit: Payer: Self-pay | Admitting: *Deleted

## 2019-03-16 ENCOUNTER — Ambulatory Visit: Payer: Self-pay | Admitting: Internal Medicine

## 2019-03-16 ENCOUNTER — Other Ambulatory Visit: Payer: Self-pay

## 2019-03-16 DIAGNOSIS — B20 Human immunodeficiency virus [HIV] disease: Secondary | ICD-10-CM

## 2019-03-17 LAB — T-HELPER CELL (CD4) - (RCID CLINIC ONLY)
CD4 % Helper T Cell: 43 % (ref 33–65)
CD4 T Cell Abs: 1365 /uL (ref 400–1790)

## 2019-03-17 LAB — URINE CYTOLOGY ANCILLARY ONLY
Chlamydia: NEGATIVE
Neisseria Gonorrhea: NEGATIVE

## 2019-03-18 LAB — CBC WITH DIFFERENTIAL/PLATELET
Absolute Monocytes: 547 cells/uL (ref 200–950)
Basophils Absolute: 64 cells/uL (ref 0–200)
Basophils Relative: 0.9 %
Eosinophils Absolute: 128 cells/uL (ref 15–500)
Eosinophils Relative: 1.8 %
HCT: 45.9 % (ref 38.5–50.0)
Hemoglobin: 15.4 g/dL (ref 13.2–17.1)
Lymphs Abs: 3124 cells/uL (ref 850–3900)
MCH: 32.1 pg (ref 27.0–33.0)
MCHC: 33.6 g/dL (ref 32.0–36.0)
MCV: 95.6 fL (ref 80.0–100.0)
MPV: 10.1 fL (ref 7.5–12.5)
Monocytes Relative: 7.7 %
Neutro Abs: 3238 cells/uL (ref 1500–7800)
Neutrophils Relative %: 45.6 %
Platelets: 244 10*3/uL (ref 140–400)
RBC: 4.8 10*6/uL (ref 4.20–5.80)
RDW: 13.4 % (ref 11.0–15.0)
Total Lymphocyte: 44 %
WBC: 7.1 10*3/uL (ref 3.8–10.8)

## 2019-03-18 LAB — HIV-1 RNA QUANT-NO REFLEX-BLD
HIV 1 RNA Quant: 33 copies/mL — ABNORMAL HIGH
HIV-1 RNA Quant, Log: 1.52 Log copies/mL — ABNORMAL HIGH

## 2019-03-18 LAB — COMPLETE METABOLIC PANEL WITH GFR
AG Ratio: 1.7 (calc) (ref 1.0–2.5)
ALT: 37 U/L (ref 9–46)
AST: 22 U/L (ref 10–35)
Albumin: 4.2 g/dL (ref 3.6–5.1)
Alkaline phosphatase (APISO): 102 U/L (ref 35–144)
BUN: 17 mg/dL (ref 7–25)
CO2: 30 mmol/L (ref 20–32)
Calcium: 9.3 mg/dL (ref 8.6–10.3)
Chloride: 102 mmol/L (ref 98–110)
Creat: 1.13 mg/dL (ref 0.70–1.33)
GFR, Est African American: 87 mL/min/{1.73_m2} (ref >=60)
GFR, Est Non African American: 75 mL/min/{1.73_m2} (ref >=60)
Globulin: 2.5 g/dL (calc) (ref 1.9–3.7)
Glucose, Bld: 87 mg/dL (ref 65–99)
Potassium: 4.2 mmol/L (ref 3.5–5.3)
Sodium: 138 mmol/L (ref 135–146)
Total Bilirubin: 0.3 mg/dL (ref 0.2–1.2)
Total Protein: 6.7 g/dL (ref 6.1–8.1)

## 2019-03-18 LAB — RPR: RPR Ser Ql: NONREACTIVE

## 2019-03-25 ENCOUNTER — Other Ambulatory Visit: Payer: Self-pay | Admitting: Internal Medicine

## 2019-04-01 ENCOUNTER — Encounter: Payer: Self-pay | Admitting: Internal Medicine

## 2019-04-06 ENCOUNTER — Telehealth: Payer: Self-pay | Admitting: *Deleted

## 2019-04-06 ENCOUNTER — Other Ambulatory Visit: Payer: Self-pay | Admitting: *Deleted

## 2019-04-06 DIAGNOSIS — B20 Human immunodeficiency virus [HIV] disease: Secondary | ICD-10-CM

## 2019-04-06 MED ORDER — DARUNAVIR ETHANOLATE 800 MG PO TABS: 800.0000 mg | ORAL_TABLET | Freq: Every day | ORAL | 3 refills | Status: DC

## 2019-04-06 NOTE — Telephone Encounter (Signed)
[facility] in [place], [place] trying to contact patient for delivery of Genvoya/Prezista.  Need to confirm address and phone number. They left message at [phone], need him to contact them at [phone].  RN attempted to let patient know, call went to voicemail at home number, voicemail not set up at 2nd number. Landis Gandy, RN

## 2019-05-12 ENCOUNTER — Other Ambulatory Visit: Payer: Self-pay

## 2019-05-12 ENCOUNTER — Encounter: Payer: Self-pay | Admitting: Internal Medicine

## 2019-05-12 ENCOUNTER — Ambulatory Visit (INDEPENDENT_AMBULATORY_CARE_PROVIDER_SITE_OTHER): Payer: Self-pay | Admitting: Internal Medicine

## 2019-05-12 VITALS — Temp 98.1°F

## 2019-05-12 DIAGNOSIS — Z79899 Other long term (current) drug therapy: Secondary | ICD-10-CM

## 2019-05-12 DIAGNOSIS — Z23 Encounter for immunization: Secondary | ICD-10-CM

## 2019-05-12 DIAGNOSIS — B20 Human immunodeficiency virus [HIV] disease: Secondary | ICD-10-CM

## 2019-05-12 NOTE — Progress Notes (Signed)
RFV: follow up for hiv disease  Patient ID: Michael BureauRobert L Wolfman, male   DOB: 25-Jul-1966, 53 y.o.   MRN: [id number]  HPI 53yo M with genvoya-DRV cd 4 count of 1340/VL 33 doing well on meds. Reports good adherence. No recent illnesses c/w covid-19. Not consistent with wearing masks in public  Outpatient Encounter Medications as of 05/12/2019  Medication Sig  . darunavir (PREZISTA) 800 MG tablet Take 1 tablet (800 mg total) by mouth daily.  . GENVOYA 150-150-200-10 MG TABS tablet TAKE 1 TABLET BY MOUTH ONCE DAILY WITH BREAKFAST  . [DISCONTINUED] megestrol (MEGACE) 40 MG tablet Take 1 tablet (40 mg total) by mouth daily. (Patient not taking: Reported on 04/06/2018)   No facility-administered encounter medications on file as of 05/12/2019.      Patient Active Problem List   Diagnosis Date Noted  . Candidiasis 10/05/2013  . Avascular necrosis of bone of right hip (HCC) 09/24/2013  . Substance abuse (HCC) 02/24/2013  . Gonorrhea 07/10/2012  . 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 Severe malnutrition (HCC) 07/09/2012  . Epididymitis, right 07/08/2012  . Leukocytosis 07/08/2012  . Contact dermatitis 10/21/2011  . Periodontal disease 10/21/2011  . Warts 10/10/2011  . Underweight 03/22/2011  . DENTAL CARIES 01/19/2009  . HIV DISEASE 12/05/2008  . ACUTE GINGIVITIS PLAQUE INDUCED 12/05/2008  . ASEPTIC NECROSIS, FEMUR HEAD/NECK 12/05/2008  . PERSONAL HISTORY OF TRAUMATIC BRAIN INJURY 12/05/2008     Health Maintenance Due  Topic Date Due  . COLONOSCOPY  07/20/2018  . INFLUENZA VACCINE  04/10/2019    Social History   Tobacco Use  . Smoking status: Current Every Day Smoker    Packs/day: 0.50    Years: 22.00    Pack years: 11.00    Types: Cigarettes    Last attempt to quit: 04/28/2013    Years since quitting: 6.0  . Smokeless tobacco: Never Used  Substance Use Topics  . Alcohol use: No    Alcohol/week: 0.0 standard drinks    Comment: not currently  . Drug use: No    Frequency:  7.0 times per week    Types: Marijuana, Cocaine    Comment: nothing since his birthday in September 08 2011   Review of Systems Review of Systems  Constitutional: Negative for fever, chills, diaphoresis, activity change, appetite change, fatigue and unexpected weight change.  HENT: Negative for congestion, sore throat, rhinorrhea, sneezing, trouble swallowing and sinus pressure.  Eyes: Negative for photophobia and visual disturbance.  Respiratory: Negative for cough, chest tightness, shortness of breath, wheezing and stridor.  Cardiovascular: Negative for chest pain, palpitations and leg swelling.  Gastrointestinal: Negative for nausea, vomiting, abdominal pain, diarrhea, constipation, blood in stool, abdominal distention and anal bleeding.  Genitourinary: Negative for dysuria, hematuria, flank pain and difficulty urinating.  Musculoskeletal: Negative for myalgias, back pain, joint swelling, arthralgias and gait problem.  Skin: Negative for color change, pallor, rash and wound.  Neurological: Negative for dizziness, tremors, weakness and light-headedness.  Hematological: Negative for adenopathy. Does not bruise/bleed easily.  Psychiatric/Behavioral: Negative for behavioral problems, confusion, sleep disturbance, dysphoric mood, decreased concentration and agitation.    Physical Exam   Temp 98.1 F (36.7 C) (Oral)   Physical Exam  Constitutional: He is oriented to person, place, and time. He appears well-developed and well-nourished. No distress.  HENT:  Mouth/Throat: Oropharynx is clear and moist. No oropharyngeal exudate.  Cardiovascular: Normal rate, regular rhythm and normal heart sounds. Exam reveals no gallop and no friction rub.  No murmur heard.  Pulmonary/Chest:  Effort normal and breath sounds normal. No respiratory distress. He has no wheezes.  Abdominal: Soft. Bowel sounds are normal. He exhibits no distension. There is no tenderness.  Lymphadenopathy:  He has no cervical  adenopathy.  Neurological: He is alert and oriented to person, place, and time.  Skin: Skin is warm and dry. No rash noted. No erythema.  Psychiatric: He has a normal mood and affect. His behavior is normal.    Lab Results  Component Value Date   CD4TCELL 43 03/16/2019   Lab Results  Component Value Date   CD4TABS 1,365 03/16/2019   CD4TABS 640 04/06/2018   CD4TABS 630 05/22/2017   Lab Results  Component Value Date   HIV1RNAQUANT 33 (H) 03/11/2019   Lab Results  Component Value Date   HEPBSAB NEG 01/11/2016   Lab Results  Component Value Date   LABRPR NON-REACTIVE 03/11/2019    CBC Lab Results  Component Value Date   WBC 7.1 03/11/2019   RBC 4.80 03/11/2019   HGB 15.4 03/11/2019   HCT 45.9 03/11/2019   PLT 244 03/11/2019   MCV 95.6 03/11/2019   MCH 32.1 03/11/2019   MCHC 33.6 03/11/2019   RDW 13.4 03/11/2019   LYMPHSABS 3,124 03/11/2019   MONOABS 1.1 (H) 08/18/2016   EOSABS 128 03/11/2019    BMET Lab Results  Component Value Date   NA 138 03/11/2019   K 4.2 03/11/2019   CL 102 03/11/2019   CO2 30 03/11/2019   GLUCOSE 87 03/11/2019   BUN 17 03/11/2019   CREATININE 1.13 03/11/2019   CALCIUM 9.3 03/11/2019   GFRNONAA 75 03/11/2019   GFRAA 87 03/11/2019      Assessment and Plan  hiv disease = continue with current regimen. Will check hiv labs  Long term medication management = will check cr to ensure still at baseline  Health maintenance = will give flu vaccine

## 2019-05-15 LAB — HIV-1 RNA QUANT-NO REFLEX-BLD
HIV 1 RNA Quant: 52 copies/mL — ABNORMAL HIGH
HIV-1 RNA Quant, Log: 1.72 Log copies/mL — ABNORMAL HIGH

## 2019-08-25 ENCOUNTER — Ambulatory Visit: Payer: Self-pay | Admitting: Internal Medicine

## 2020-02-05 ENCOUNTER — Encounter: Payer: Self-pay | Admitting: Emergency Medicine

## 2020-02-05 ENCOUNTER — Emergency Department

## 2020-02-05 ENCOUNTER — Emergency Department
Admission: EM | Admit: 2020-02-05 | Discharge: 2020-02-05 | Disposition: A | Discharge status: Home / Self Care | Attending: Emergency Medicine | Admitting: Emergency Medicine

## 2020-02-05 ENCOUNTER — Other Ambulatory Visit: Payer: Self-pay

## 2020-02-05 DIAGNOSIS — S61211A Laceration without foreign body of left index finger without damage to nail, initial encounter: Secondary | ICD-10-CM | POA: Insufficient documentation

## 2020-02-05 DIAGNOSIS — B20 Human immunodeficiency virus [HIV] disease: Secondary | ICD-10-CM | POA: Insufficient documentation

## 2020-02-05 DIAGNOSIS — Y999 Unspecified external cause status: Secondary | ICD-10-CM | POA: Insufficient documentation

## 2020-02-05 DIAGNOSIS — Y929 Unspecified place or not applicable: Secondary | ICD-10-CM | POA: Insufficient documentation

## 2020-02-05 DIAGNOSIS — R519 Headache, unspecified: Secondary | ICD-10-CM | POA: Insufficient documentation

## 2020-02-05 DIAGNOSIS — W19XXXA Unspecified fall, initial encounter: Secondary | ICD-10-CM

## 2020-02-05 DIAGNOSIS — M79605 Pain in left leg: Secondary | ICD-10-CM | POA: Insufficient documentation

## 2020-02-05 DIAGNOSIS — M25552 Pain in left hip: Secondary | ICD-10-CM | POA: Insufficient documentation

## 2020-02-05 DIAGNOSIS — Y9302 Activity, running: Secondary | ICD-10-CM | POA: Insufficient documentation

## 2020-02-05 DIAGNOSIS — Z23 Encounter for immunization: Secondary | ICD-10-CM | POA: Insufficient documentation

## 2020-02-05 DIAGNOSIS — M25512 Pain in left shoulder: Secondary | ICD-10-CM | POA: Insufficient documentation

## 2020-02-05 DIAGNOSIS — M549 Dorsalgia, unspecified: Secondary | ICD-10-CM | POA: Insufficient documentation

## 2020-02-05 DIAGNOSIS — Z79899 Other long term (current) drug therapy: Secondary | ICD-10-CM | POA: Insufficient documentation

## 2020-02-05 DIAGNOSIS — W01110A Fall on same level from slipping, tripping and stumbling with subsequent striking against sharp glass, initial encounter: Secondary | ICD-10-CM | POA: Insufficient documentation

## 2020-02-05 DIAGNOSIS — M79642 Pain in left hand: Secondary | ICD-10-CM

## 2020-02-05 LAB — ETHANOL: Alcohol, Ethyl (B): 30 mg/dL — ABNORMAL HIGH (ref <10)

## 2020-02-05 LAB — URINALYSIS, COMPLETE (UACMP) WITH MICROSCOPIC
Bilirubin Urine: NEGATIVE
Glucose, UA: NEGATIVE mg/dL
Ketones, ur: NEGATIVE mg/dL
Leukocytes,Ua: NEGATIVE
Nitrite: NEGATIVE
Protein, ur: NEGATIVE mg/dL
Specific Gravity, Urine: 1.011 (ref 1.005–1.030)
Squamous Epithelial / HPF: NONE SEEN (ref 0–5)
pH: 5 (ref 5.0–8.0)

## 2020-02-05 LAB — CBC
HCT: 45.8 % (ref 39.0–52.0)
Hemoglobin: 16.4 g/dL (ref 13.0–17.0)
MCH: 34.2 pg — ABNORMAL HIGH (ref 26.0–34.0)
MCHC: 35.8 g/dL (ref 30.0–36.0)
MCV: 95.6 fL (ref 80.0–100.0)
Platelets: 234 10*3/uL (ref 150–400)
RBC: 4.79 MIL/uL (ref 4.22–5.81)
RDW: 12.9 % (ref 11.5–15.5)
WBC: 9.4 10*3/uL (ref 4.0–10.5)
nRBC: 0 % (ref 0.0–0.2)

## 2020-02-05 LAB — BASIC METABOLIC PANEL
Anion gap: 11 (ref 5–15)
BUN: 12 mg/dL (ref 6–20)
CO2: 21 mmol/L — ABNORMAL LOW (ref 22–32)
Calcium: 8.8 mg/dL — ABNORMAL LOW (ref 8.9–10.3)
Chloride: 103 mmol/L (ref 98–111)
Creatinine, Ser: 0.96 mg/dL (ref 0.61–1.24)
GFR calc Af Amer: >60 mL/min (ref >60)
GFR calc non Af Amer: >60 mL/min (ref >60)
Glucose, Bld: 83 mg/dL (ref 70–99)
Potassium: 4.1 mmol/L (ref 3.5–5.1)
Sodium: 135 mmol/L (ref 135–145)

## 2020-02-05 LAB — URINE DRUG SCREEN, QUALITATIVE (ARMC ONLY)
Amphetamines, Ur Screen: NOT DETECTED
Barbiturates, Ur Screen: NOT DETECTED
Benzodiazepine, Ur Scrn: NOT DETECTED
Cannabinoid 50 Ng, Ur ~~LOC~~: POSITIVE — AB
Cocaine Metabolite,Ur ~~LOC~~: POSITIVE — AB
MDMA (Ecstasy)Ur Screen: NOT DETECTED
Methadone Scn, Ur: NOT DETECTED
Opiate, Ur Screen: NOT DETECTED
Phencyclidine (PCP) Ur S: NOT DETECTED
Tricyclic, Ur Screen: NOT DETECTED

## 2020-02-05 MED ORDER — SODIUM CHLORIDE 0.9 % IV BOLUS
1000.0000 mL | Freq: Once | INTRAVENOUS | Status: AC
  Administered 2020-02-05: 1000 mL via INTRAVENOUS

## 2020-02-05 MED ORDER — TETANUS-DIPHTH-ACELL PERTUSSIS 5-2.5-18.5 LF-MCG/0.5 IM SUSP
0.5000 mL | Freq: Once | INTRAMUSCULAR | Status: AC
  Administered 2020-02-05: 0.5 mL via INTRAMUSCULAR
  Filled 2020-02-05: qty 0.5

## 2020-02-05 NOTE — Discharge Instructions (Addendum)
Your head CT, neck CT, chest x-ray, knee x-ray, hand x-ray were normal.  Please follow-up with primary care to recheck your finger laceration.  Your shoulder x-ray does not show that anything is broken but may have some instability.  Your hip x-ray has sclerosis of the femoral head and will need to be followed up with orthopedics.  Please follow-up with orthopedics for your shoulder and your hip.

## 2020-02-05 NOTE — ED Triage Notes (Addendum)
Pt arrived via [facility] in [employer] custody, after running from the police. Pt ran about 1/2-1 mile before being placed in police custody.  Per [facility] pt c/o shortness of breath and has hx of self diagnosed asthma, admitted to using crack today, and fell while running.   Pt c/o left side pain all the way down to  Feet per EMS.   Pt also cut his left index finger on glass which is currently wrapped by EMS.  [facility].  Pt c/o left shoulder pain, back, hips legs and left index finger pain.

## 2020-02-05 NOTE — ED Notes (Signed)
Superficial abrasion noted to left shoulder. Pt c/o left sided arm pain w/ movement. No obvious deformity noted.

## 2020-02-05 NOTE — ED Provider Notes (Signed)
[facility] Emergency Department Provider Note  ____________________________________________  Time seen: Approximately 11:39 AM  I have reviewed the triage vital signs and the nursing notes.   HISTORY  Chief Complaint Medical Clearance    HPI Michael [name] is a 54 y.o. male that presents to the emergency department for clearance for jail.  Patient presents today in [employer] custody after running from the police.  Patient ran about 1/2 to 1 mile before being placed into police custody.  He fell after running landing on glass.  Patient is unable to provide additional history at this time.  Past Medical History:  Diagnosis Date  . Cocaine abuse (HCC)   . DJD (degenerative joint disease)    bilateral hips  . ETOH abuse   . HIV disease (HCC)   . Marijuana abuse     Patient Active Problem List   Diagnosis Date Noted  . Candidiasis 10/05/2013  . Avascular necrosis of bone of right hip (HCC) 09/24/2013  . Substance abuse (HCC) 02/24/2013  . Gonorrhea 07/10/2012  . 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 Severe malnutrition (HCC) 07/09/2012  . Epididymitis, right 07/08/2012  . Leukocytosis 07/08/2012  . Contact dermatitis 10/21/2011  . Periodontal disease 10/21/2011  . Warts 10/10/2011  . Underweight 03/22/2011  . DENTAL CARIES 01/19/2009  . HIV DISEASE 12/05/2008  . ACUTE GINGIVITIS PLAQUE INDUCED 12/05/2008  . ASEPTIC NECROSIS, FEMUR HEAD/NECK 12/05/2008  . PERSONAL HISTORY OF TRAUMATIC BRAIN INJURY 12/05/2008    Past Surgical History:  Procedure Laterality Date  . CEREBRAL ANEURYSM REPAIR  1995  . 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 TOTAL HIP ARTHROPLASTY Right 09/24/2013   Procedure: RIGHT TOTAL HIP ARTHROPLASTY;  Surgeon: Thera FlakeW D Caffrey Jr., MD;  Location: [facility] OR;  Service: Orthopedics;  Laterality: Right;    Prior to Admission medications   Medication Sig Start Date End Date Taking? Authorizing Provider  darunavir  (PREZISTA) 800 MG tablet Take 1 tablet (800 mg total) by mouth daily. 04/06/19   Judyann MunsonSnider, Cynthia, MD  GENVOYA 150-150-200-10 MG TABS tablet TAKE 1 TABLET BY MOUTH ONCE DAILY WITH BREAKFAST 03/25/19   Ginnie SmartHatcher, Jeffrey C, MD    Allergies Oxycodone, Doxycycline, Dilantin [phenytoin sodium extended], Hydrocodone-acetaminophen, Levofloxacin, and Phenytoin  Family History  Problem Relation Age of Onset  . Hypertension Mother   . Hepatitis C Father     Social History Social History   Tobacco Use  . Smoking status: Current Every Day Smoker    Packs/day: 0.50    Years: 22.00    Pack years: 11.00    Types: Cigarettes    Last attempt to quit: 04/28/2013    Years since quitting: 6.7  . Smokeless tobacco: Never Used  Substance Use Topics  . Alcohol use: No    Alcohol/week: 0.0 standard drinks    Comment: not currently  . Drug use: No    Frequency: 7.0 times per week    Types: Marijuana, Cocaine    Comment: nothing since his [birth year] in September 08 2011     Review of Systems  Cardiovascular: No chest pain. Respiratory: No cough. No SOB. Gastrointestinal: No abdominal pain.  No nausea, no vomiting.  Musculoskeletal: Positive for shoulder and bilateral knee pain. Skin: Negative for rash,  lacerations, ecchymosis.  Positive for left shoulder abrasion and shave abrasion to finger. Neurological: Positive for headache.   ____________________________________________   PHYSICAL EXAM:  VITAL SIGNS: ED Triage Vitals  Enc Vitals Group     BP  02/05/20 1027 109/77     Pulse Rate 02/05/20 1027 (!) 110     Resp 02/05/20 1027 20     Temp 02/05/20 1027 98.3 F (36.8 C)     Temp Source 02/05/20 1027 Oral     SpO2 02/05/20 1027 100 %     Weight 02/05/20 1028 200 lb (90.7 kg)     Height 02/05/20 1028 5\' 7"  (1.702 m)     Head Circumference --      Peak Flow --      Pain Score 02/05/20 1035 10     Pain Loc --      Pain Edu? --      Excl. in [place]? --      Constitutional: Alert and  oriented. Well appearing and in no acute distress. Eyes: Conjunctivae are normal. PERRL. EOMI. Head: Atraumatic. ENT:      Ears:      Nose: No congestion/rhinnorhea.      Mouth/Throat: Mucous membranes are moist.  Neck: No stridor.  No cervical spine tenderness to palpation. Cardiovascular: Normal rate, regular rhythm.  Good peripheral circulation. Respiratory: Normal respiratory effort without tachypnea or retractions. Lungs CTAB. Good air entry to the bases with no decreased or absent breath sounds. Gastrointestinal: Bowel sounds 4 quadrants. Soft and nontender to palpation. No guarding or rigidity. No palpable masses. No distention.  Musculoskeletal: Full range of motion to all extremities. No gross deformities appreciated.  Limited range of motion of left shoulder due to pain.  Full range of motion of left hand, left hip, left knee, left ankle. Neurologic:  Normal speech and language. No gross focal neurologic deficits are appreciated.  Skin:  Skin is warm, dry and intact.  Shave laceration to left index finger. Psychiatric: Mood and affect are normal. Speech and behavior are normal. Patient exhibits appropriate insight and judgement.   ____________________________________________   LABS (all labs ordered are listed, but only abnormal results are displayed)  Labs Reviewed  CBC - Abnormal; Notable for the following components:      Result Value   MCH 34.2 (*)    All other components within normal limits  BASIC METABOLIC PANEL - Abnormal; Notable for the following components:   CO2 21 (*)    Calcium 8.8 (*)    All other components within normal limits  ETHANOL - Abnormal; Notable for the following components:   Alcohol, Ethyl (B) 30 (*)    All other components within normal limits  URINALYSIS, COMPLETE (UACMP) WITH MICROSCOPIC - Abnormal; Notable for the following components:   Color, Urine YELLOW (*)    APPearance HAZY (*)    Hgb urine dipstick SMALL (*)    Bacteria, UA RARE  (*)    All other components within normal limits  URINE DRUG SCREEN, QUALITATIVE ([facility] ONLY) - Abnormal; Notable for the following components:   Cocaine Metabolite,Ur [place] POSITIVE (*)    Cannabinoid 50 Ng, Ur [place] POSITIVE (*)    All other components within normal limits   ____________________________________________  EKG   ____________________________________________  RADIOLOGY   DG Chest 2 View  Result Date: 02/05/2020 CLINICAL DATA:  Shortness of breath EXAM: CHEST - 2 VIEW COMPARISON:  09/15/2013 FINDINGS: The heart size and mediastinal contours are within normal limits. Both lungs are clear. The visualized skeletal structures are unremarkable. IMPRESSION: No active cardiopulmonary disease. Electronically Signed   By: Inez Catalina M.D.   On: 02/05/2020 12:29   DG Knee 2 Views Left  Result Date: 02/05/2020 CLINICAL DATA:  Pain after fall EXAM: LEFT KNEE - 1-2 VIEW COMPARISON:  None. FINDINGS: No evidence of fracture, dislocation, or joint effusion. No evidence of arthropathy or other focal bone abnormality. Soft tissues are unremarkable. IMPRESSION: Negative. Electronically Signed   By: Gerome Sam III M.D   On: 02/05/2020 14:46   CT Head Wo Contrast  Result Date: 02/05/2020 CLINICAL DATA:  Pain following fall. EXAM: CT HEAD WITHOUT CONTRAST CT CERVICAL SPINE WITHOUT CONTRAST TECHNIQUE: Multidetector CT imaging of the head and cervical spine was performed following the standard protocol without intravenous contrast. Multiplanar CT image reconstructions of the cervical spine were also generated. COMPARISON:  Head CT May 29, 2012 cervical spine radiographs September 02, 2015 FINDINGS: CT HEAD FINDINGS Brain: Ventricles and sulci are normal in size and configuration. There is no intracranial mass, hemorrhage, extra-axial fluid collection, midline shift. The brain parenchyma appears unremarkable. Vascular: No hyperdense vessel.  No evident vascular calcification. Skull: The patient  has had a previous frontal-temporal craniotomy, unchanged. Bony calvarium otherwise appears intact and stable. Sinuses/Orbits: There is opacification in several ethmoid air cells. Other visualized paranasal sinuses are clear. Visualized orbits appear symmetric bilaterally. Other: Mastoid air cells are clear. There is debris in each external auditory canal. CT CERVICAL SPINE FINDINGS Alignment: There is no appreciable spondylolisthesis. Skull base and vertebrae: Skull base and craniocervical junction regions appear normal. No evident acute fracture. No blastic or lytic bone lesions. Soft tissues and spinal canal: Prevertebral soft tissues and predental space regions are normal. No paraspinous lesions. No cord canal hematoma. Disc levels: There is mild disc space narrowing at C2-3. There is moderate disc space narrowing at C5-6. There is moderately severe disc space narrowing at C7-T1. There is a small focus of calcification in the posterior longitudinal ligament at C6-7. There is facet hypertrophy at several levels, most notably at C3-4 on the right and at C6-7 on the right. There is exit foraminal narrowing on the right at C6-7 due to bony hypertrophy. A lesser degree of exit foraminal narrowing is noted at C3-4 on the right due to bony hypertrophy. There is an osteophyte posteriorly at C6 which abuts the cord but does not cause high-grade stenosis. No frank disc extrusion or high-grade stenosis noted on this 6 study. Upper chest: Visualized upper lung regions are clear. Other: There is slight calcification in the left carotid artery. IMPRESSION: CT head: Brain parenchyma appears unremarkable. No mass or hemorrhage. Status post previous right frontal-temporal craniotomy. No new bone lesion. Foci of paranasal sinus disease noted. Probable cerumen in each external auditory canal. CT cervical spine: No fracture or spondylolisthesis. 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 Electronically Signed    By: Bretta Bang III M.D.   On: 02/05/2020 12:20   CT Cervical Spine Wo Contrast  Result Date: 02/05/2020 CLINICAL DATA:  Pain following fall. EXAM: CT HEAD WITHOUT CONTRAST CT CERVICAL SPINE WITHOUT CONTRAST TECHNIQUE: Multidetector CT imaging of the head and cervical spine was performed following the standard protocol without intravenous contrast. Multiplanar CT image reconstructions of the cervical spine were also generated. COMPARISON:  Head CT May 29, 2012 cervical spine radiographs September 02, 2015 FINDINGS: CT HEAD FINDINGS Brain: Ventricles and sulci are normal in size and configuration. There is no intracranial mass, hemorrhage, extra-axial fluid collection, midline shift. The brain parenchyma appears unremarkable. Vascular: No hyperdense vessel.  No evident vascular calcification. Skull: The patient has had a previous frontal-temporal craniotomy, unchanged. Bony calvarium otherwise appears intact and stable. Sinuses/Orbits: There is opacification  in several ethmoid air cells. Other visualized paranasal sinuses are clear. Visualized orbits appear symmetric bilaterally. Other: Mastoid air cells are clear. There is debris in each external auditory canal. CT CERVICAL SPINE FINDINGS Alignment: There is no appreciable spondylolisthesis. Skull base and vertebrae: Skull base and craniocervical junction regions appear normal. No evident acute fracture. No blastic or lytic bone lesions. Soft tissues and spinal canal: Prevertebral soft tissues and predental space regions are normal. No paraspinous lesions. No cord canal hematoma. Disc levels: There is mild disc space narrowing at C2-3. There is moderate disc space narrowing at C5-6. There is moderately severe disc space narrowing at C7-T1. There is a small focus of calcification in the posterior longitudinal ligament at C6-7. There is facet hypertrophy at several levels, most notably at C3-4 on the right and at C6-7 on the right. There is exit  foraminal narrowing on the right at C6-7 due to bony hypertrophy. A lesser degree of exit foraminal narrowing is noted at C3-4 on the right due to bony hypertrophy. There is an osteophyte posteriorly at C6 which abuts the cord but does not cause high-grade stenosis. No frank disc extrusion or high-grade stenosis noted on this 6 study. Upper chest: Visualized upper lung regions are clear. Other: There is slight calcification in the left carotid artery. IMPRESSION: CT head: Brain parenchyma appears unremarkable. No mass or hemorrhage. Status post previous right frontal-temporal craniotomy. No new bone lesion. Foci of paranasal sinus disease noted. Probable cerumen in each external auditory canal. CT cervical spine: No fracture or spondylolisthesis. 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 Electronically Signed   By: Bretta Bang III M.D.   On: 02/05/2020 12:20   DG Shoulder Left  Result Date: 02/05/2020 CLINICAL DATA:  Recent fall with left shoulder pain, initial encounter EXAM: LEFT SHOULDER - 2+ VIEW COMPARISON:  None. FINDINGS: Degenerative changes of the glenohumeral joint are seen. No acute fracture or dislocation is seen. No soft tissue abnormality is noted. IMPRESSION: Mild degenerative change without acute abnormality. Electronically Signed   By: Alcide Clever M.D.   On: 02/05/2020 12:30   DG Hand Complete Left  Result Date: 02/05/2020 CLINICAL DATA:  Pain after fall.  Multiple small lacerations. EXAM: LEFT HAND - COMPLETE 3+ VIEW COMPARISON:  None. FINDINGS: No fractures or dislocations. No significant degenerative changes. No foreign bodies identified. IMPRESSION: Negative. Electronically Signed   By: Gerome Sam III M.D   On: 02/05/2020 14:45   DG Hip Unilat W or Wo Pelvis 2-3 Views Left  Result Date: 02/05/2020 CLINICAL DATA:  Pain following fall EXAM: DG HIP (WITH OR WITHOUT PELVIS) 2-3V LEFT COMPARISON:  None. FINDINGS: Frontal pelvis as well as frontal and  lateral left hip images were obtained. There is a total hip replacement on the right with prosthetic components well-seated. No fracture or dislocation evident. There is slight narrowing of the left hip joint. There is sclerosis in the left femoral head region. No erosion. Mild bony overgrowth is noted along the superolateral left acetabulum. IMPRESSION: 1. No fracture or dislocation. Mild narrowing left hip joint. Sclerosis in the left femoral head region may represent a degree of avascular necrosis. 2. Bony overgrowth along the superolateral left acetabulum may place patient at increased risk for femoroacetabular impingement. 3. Total hip replacement right with prosthetic components well-seated. Electronically Signed   By: Bretta Bang III M.D.   On: 02/05/2020 14:46    ____________________________________________    PROCEDURES  Procedure(s) performed:    Procedures  Medications  sodium chloride 0.9 % bolus 1,000 mL (1,000 mLs Intravenous New Bag/Given 02/05/20 1447)  Tdap (BOOSTRIX) injection 0.5 mL (0.5 mLs Intramuscular Given 02/05/20 1447)     ____________________________________________   INITIAL IMPRESSION / ASSESSMENT AND PLAN / ED COURSE  Pertinent labs & imaging results that were available during my care of the patient were reviewed by me and considered in my medical decision making (see chart for details).  Review of the [place] CSRS was performed in accordance of the [facility] prior to dispensing any controlled drugs.   Patient presented to the emergency department for jail clearance.  Vital signs and exam are reassuring.  When patient arrived to the emergency department, he was unable to answer many questions due to being very sleepy.  CT head, cervical spine, chest x-ray, shoulder x-ray were ordered to further evaluate and negative for acute abnormalities.  Patient has a shave laceration to his left index finger.  There is nothing to repair at this time.  Bleeding is  controlled.  Finger was wrapped with bandage.  Tetanus shot was updated.  After patient had rested in the emergency department for several hours, he became more alert and able to appropriately answer all questions.  He states that he was running from [employer] and fell landing on glass.  He remembers the fall.  He is still having pain to his left hand, left hip, left knee.    X-rays were ordered now to further evaluate and also negative for acute abnormalities. Patient was able to ambulate to use the bathroom. Patient is positive for alcohol use, cocaine, marijuana.  Patient discharged in police care.  Patient is to follow up with primary care and orthopedics as directed. Patient is given ED precautions to return to the ED for any worsening or new symptoms.  Michael [name] was evaluated in Emergency Department on 02/05/2020 for the symptoms described in the history of present illness. He was evaluated in the context of the global COVID-19 pandemic, which necessitated consideration that the patient might be at risk for infection with the SARS-CoV-2 virus that causes COVID-19. Institutional protocols and algorithms that pertain to the evaluation of patients at risk for COVID-19 are in a state of rapid change based on information released by regulatory bodies including the CDC and federal and state organizations. These policies and algorithms were followed during the patient's care in the ED.   ____________________________________________  FINAL CLINICAL IMPRESSION(S) / ED DIAGNOSES  Final diagnoses:  Fall, initial encounter      NEW MEDICATIONS STARTED DURING THIS VISIT:  ED Discharge Orders    None          This chart was dictated using voice recognition software/Dragon. Despite best efforts to proofread, errors can occur which can change the meaning. Any change was purely unintentional.    Enid Derry, PA-C 02/06/20 0370    Chesley Noon, MD 02/06/20 [id number]

## 2020-02-05 NOTE — ED Notes (Signed)
Pt in CT.

## 2020-02-08 ENCOUNTER — Telehealth: Payer: Self-pay | Admitting: Pharmacy Technician

## 2020-02-08 NOTE — Telephone Encounter (Signed)
[facility] Patient Advocate Encounter   I was successful in securing patient a $7500 grant from [employer] ([employer]) to provide copayment coverage for GENVOYA and PREZISTA .  This will make the out of pocket cost $0.      The billing information is as follows and has been shared with [employer]      Netty Starring. Dimas Aguas CPhT [facility] Phone: [phone] Fax:  [phone]

## 2020-06-01 ENCOUNTER — Ambulatory Visit: Payer: Self-pay | Admitting: Physician Assistant

## 2020-06-01 ENCOUNTER — Other Ambulatory Visit: Payer: Self-pay

## 2020-06-01 DIAGNOSIS — Z113 Encounter for screening for infections with a predominantly sexual mode of transmission: Secondary | ICD-10-CM

## 2020-06-01 DIAGNOSIS — A5401 Gonococcal cystitis and urethritis, unspecified: Secondary | ICD-10-CM

## 2020-06-01 LAB — GRAM STAIN

## 2020-06-01 MED ORDER — CEFTRIAXONE SODIUM 500 MG IJ SOLR
500.0000 mg | Freq: Once | INTRAMUSCULAR | Status: AC
  Administered 2020-06-01: 500 mg via INTRAMUSCULAR

## 2020-06-01 MED ORDER — AZITHROMYCIN 500 MG PO TABS
1000.0000 mg | ORAL_TABLET | Freq: Once | ORAL | Status: AC
  Administered 2020-06-01: 1000 mg via ORAL

## 2020-06-01 MED ORDER — AZITHROMYCIN 500 MG PO TABS: 2000.0000 mg | ORAL_TABLET | Freq: Once | ORAL | Status: DC

## 2020-06-01 MED ORDER — GENTAMICIN SULFATE 40 MG/ML IJ SOLN: 240.0000 mg | Freq: Once | INTRAMUSCULAR | Status: DC

## 2020-06-01 NOTE — Progress Notes (Signed)
Gram stain reviewed and pt treated for Gonorrhea per provider orders and pt tolerated treatment well. Counseled pt per provider orders and pt states understanding. Provider orders completed.

## 2020-06-03 ENCOUNTER — Encounter: Payer: Self-pay | Admitting: Physician Assistant

## 2020-06-03 NOTE — Progress Notes (Signed)
[facility] Department STI clinic/screening visit  Subjective:  Michael [name] is a 54 y.o. male being seen today for an STI screening visit. The patient reports they do have symptoms.    Patient has the following medical conditions:   Patient Active Problem List   Diagnosis Date Noted  . Candidiasis 10/05/2013  . Avascular necrosis of bone of right hip (HCC) 09/24/2013  . Substance abuse (HCC) 02/24/2013  . Gonorrhea 07/10/2012  . 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 Severe malnutrition (HCC) 07/09/2012  . Epididymitis, right 07/08/2012  . Leukocytosis 07/08/2012  . Contact dermatitis 10/21/2011  . Periodontal disease 10/21/2011  . Warts 10/10/2011  . Underweight 03/22/2011  . DENTAL CARIES 01/19/2009  . HIV DISEASE 12/05/2008  . ACUTE GINGIVITIS PLAQUE INDUCED 12/05/2008  . ASEPTIC NECROSIS, FEMUR HEAD/NECK 12/05/2008  . PERSONAL HISTORY OF TRAUMATIC BRAIN INJURY 12/05/2008     Chief Complaint  Patient presents with  . SEXUALLY TRANSMITTED DISEASE    screening    HPI  Patient reports that he has been having dysuria and white discharge for 3 days.  Denies other symptoms and current medicines.  States that he recently ran out of his medicines and has an appointment with his PCP to get refills.      See flowsheet for further details and programmatic requirements.    The following portions of the patient's history were reviewed and updated as appropriate: allergies, current medications, past medical history, past social history, past surgical history and problem list.  Objective:  There were no vitals filed for this visit.  Physical Exam Constitutional:      General: He is not in acute distress.    Appearance: Normal appearance.  HENT:     Head: Normocephalic and atraumatic.     Comments: No nits, lice, or hair loss. No cervical, supraclavicular or axillary adenopathy.    Mouth/Throat:     Mouth: Mucous membranes are moist.     Pharynx:  Oropharynx is clear. No oropharyngeal exudate or posterior oropharyngeal erythema.  Eyes:     Conjunctiva/sclera: Conjunctivae normal.  Pulmonary:     Effort: Pulmonary effort is normal.  Abdominal:     Palpations: Abdomen is soft. There is no mass.     Tenderness: There is no abdominal tenderness. There is no guarding or rebound.  Genitourinary:    Penis: Normal.      Testes: Normal.     Comments: Pubic area without nits, lice, edema, erythema, lesions and inguinal adenopathy. Penis circumcised, without rash or lesions. Moderate amount of yellow/green discharge at meatus. Musculoskeletal:     Cervical back: Neck supple. No tenderness.  Skin:    General: Skin is warm and dry.     Findings: No bruising, erythema, lesion or rash.  Neurological:     Mental Status: He is alert and oriented to person, place, and time.  Psychiatric:        Mood and Affect: Mood normal.        Behavior: Behavior normal.        Thought Content: Thought content normal.        Judgment: Judgment normal.       Assessment and Plan:  Michael [name] is a 54 y.o. male presenting to the [facility] Department for STI screening  1. Screening for STD (sexually transmitted disease) Patient into clinic with symptoms. Patient declines blood work today. Rec condoms with all sex. Await test results.  Counseled that RN will call if  needs to RTC for treatment once results are back. - Gram stain  2. Gonococcal urethritis in male Treat for GC and cover for Chlamydia with Ceftriaxone 500 mg IM and Azithromycin 1 g po DOT today. No sex for 7 days and until after partner completes treatment. RTC if vomits < 2 hr after taking medicine for re-treatment. - azithromycin (ZITHROMAX) tablet 1,000 mg - cefTRIAXone (ROCEPHIN) injection 500 mg     Return if symptoms worsen or fail to improve, ~3 months for TOC.  No future appointments.  Matt Holmes, PA

## 2021-10-09 ENCOUNTER — Telehealth: Payer: Self-pay

## 2021-10-09 NOTE — Telephone Encounter (Signed)
Patient last seen 05/2019 - called to offer appointment. Patient's mother answered, she is listed as a contact in his chart and states she is not with Michael [name] at the moment, but will reach out to him and his sister and have them give [place] a call back. Did not disclose any private or health information.   Beryle Flock, RN

## 2021-12-24 ENCOUNTER — Telehealth: Payer: Self-pay

## 2021-12-24 NOTE — Telephone Encounter (Signed)
Called patient, his mother answered. Asked her to please have him call his doctor's office back for an appointment. Did not disclose any private or health information.  ? ?Referral sent to state bridge counselor 11/2021. ? ?Beryle Flock, RN ? ?

## 2022-01-24 ENCOUNTER — Ambulatory Visit: Payer: Self-pay

## 2022-01-24 ENCOUNTER — Encounter: Payer: Self-pay | Admitting: Internal Medicine

## 2022-01-24 ENCOUNTER — Other Ambulatory Visit: Payer: Self-pay

## 2022-01-24 ENCOUNTER — Ambulatory Visit (INDEPENDENT_AMBULATORY_CARE_PROVIDER_SITE_OTHER): Payer: Self-pay | Admitting: Internal Medicine

## 2022-01-24 VITALS — BP 125/79 | HR 71 | Temp 97.8°F | Ht 69.0 in | Wt 157.0 lb

## 2022-01-24 DIAGNOSIS — M87051 Idiopathic aseptic necrosis of right femur: Secondary | ICD-10-CM

## 2022-01-24 DIAGNOSIS — Z91148 Patient's other noncompliance with medication regimen for other reason: Secondary | ICD-10-CM

## 2022-01-24 DIAGNOSIS — B20 Human immunodeficiency virus [HIV] disease: Secondary | ICD-10-CM

## 2022-01-24 DIAGNOSIS — E41 Nutritional marasmus: Secondary | ICD-10-CM

## 2022-01-24 DIAGNOSIS — R634 Abnormal weight loss: Secondary | ICD-10-CM

## 2022-01-24 MED ORDER — DARUNAVIR 800 MG PO TABS: 800.0000 mg | ORAL_TABLET | Freq: Every day | ORAL | 11 refills | Status: DC

## 2022-01-24 MED ORDER — GENVOYA 150-150-200-10 MG PO TABS: 1.0000 | ORAL_TABLET | Freq: Every day | ORAL | 11 refills | Status: DC

## 2022-01-24 NOTE — Progress Notes (Signed)
RFV: follow up for hiv disease  Patient ID: Michael [name], male   DOB: [birth year], 56 y.o.   MRN: [id number]  HPI Michael [name] is a 55yo M with HIV disease on genvoya-DRV,   Out of care for 2 years+, out of refills and not sure how to come back Soc hx: MJ and cocaine  Weight loss - 20lb over the last year Noticed and left neck nodule growing  Left neck nodule  Right side hip pain,  Brain surgery in 1995- has been on disability since 2010    Outpatient Encounter Medications as of 01/24/2022  Medication Sig   darunavir (PREZISTA) 800 MG tablet Take 1 tablet (800 mg total) by mouth daily. (Patient not taking: Reported on 01/24/2022)   GENVOYA 150-150-200-10 MG TABS tablet TAKE 1 TABLET BY MOUTH ONCE DAILY WITH BREAKFAST (Patient not taking: Reported on 01/24/2022)   No facility-administered encounter medications on file as of 01/24/2022.     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   Severe malnutrition ([place]) 07/09/2012   Epididymitis, right 07/08/2012   Leukocytosis 07/08/2012   Contact dermatitis 10/21/2011   Periodontal disease 10/21/2011   Warts 10/10/2011   Underweight 03/22/2011   DENTAL CARIES 01/19/2009   HIV DISEASE 12/05/2008   ACUTE GINGIVITIS PLAQUE INDUCED 12/05/2008   ASEPTIC NECROSIS, FEMUR HEAD/NECK 12/05/2008   PERSONAL HISTORY OF TRAUMATIC BRAIN INJURY 12/05/2008     Health Maintenance Due  Topic Date Due   COVID-19 Vaccine (1) Never done   Zoster Vaccines- Shingrix (1 of 2) Never done   COLONOSCOPY (Pts 45-83yrs Insurance coverage will need to be confirmed)  Never done     Review of Systems Review of Systems  Constitutional: Negative for fever, chills, diaphoresis, activity change, appetite change, fatigue and unexpected weight change.  HENT: Negative for  congestion, sore throat, rhinorrhea, sneezing, trouble swallowing and sinus pressure.  Eyes: Negative for photophobia and visual disturbance.  Respiratory: Negative for cough, chest tightness, shortness of breath, wheezing and stridor.  Cardiovascular: Negative for chest pain, palpitations and leg swelling.  Gastrointestinal: Negative for nausea, vomiting, abdominal pain, diarrhea, constipation, blood in stool, abdominal distention and anal bleeding.  Genitourinary: Negative for dysuria, hematuria, flank pain and difficulty urinating.  Musculoskeletal: Negative for myalgias, back pain, joint swelling, arthralgias and gait problem.  Skin: Negative for color change, pallor, rash and wound.  Neurological: Negative for dizziness, tremors, weakness and light-headedness.  Hematological: Negative for adenopathy. Does not bruise/bleed easily.  Psychiatric/Behavioral: Negative for behavioral problems, confusion, sleep disturbance, dysphoric mood, decreased concentration and agitation.   Physical Exam   BP 125/79   Pulse 71   Temp 97.8 F (36.6 C) (Oral)   Ht 5\' 9"  (1.753 m)   Wt 157 lb (71.2 kg)   BMI 23.18 kg/m   Physical Exam  Constitutional: He is oriented to person, place, and time. He appears well-developed and well-nourished. No distress.  HENT:  Mouth/Throat: Oropharynx is clear and moist. No oropharyngeal exudate.  Cardiovascular: Normal rate, regular rhythm and normal heart sounds. Exam reveals no gallop and no friction rub.  No murmur heard.  Pulmonary/Chest: Effort normal and breath sounds normal. No respiratory distress. He has no wheezes.  Abdominal: Soft. Bowel sounds are normal. He exhibits no distension. There is no tenderness.  Lymphadenopathy:  He has no  cervical adenopathy. Paroditis+ of left side 6 x 4 cm.  Neurological: He is alert and oriented to person, place, and time.  Skin: Skin is warm and dry. No rash noted. No erythema.  Psychiatric: He has a normal mood and  affect. His behavior is normal.   Lab Results  Component Value Date   CD4TCELL 43 03/16/2019   Lab Results  Component Value Date   CD4TABS 1,365 03/16/2019   CD4TABS 640 04/06/2018   CD4TABS 630 05/22/2017   Lab Results  Component Value Date   HIV1RNAQUANT 52 (H) 05/12/2019   Lab Results  Component Value Date   HEPBSAB NEG 01/11/2016   Lab Results  Component Value Date   LABRPR NON-REACTIVE 03/11/2019    CBC Lab Results  Component Value Date   WBC 9.4 02/05/2020   RBC 4.79 02/05/2020   HGB 16.4 02/05/2020   HCT 45.8 02/05/2020   PLT 234 02/05/2020   MCV 95.6 02/05/2020   MCH 34.2 (H) 02/05/2020   MCHC 35.8 02/05/2020   RDW 12.9 02/05/2020   LYMPHSABS 3,124 03/11/2019   MONOABS 1.1 (H) 08/18/2016   EOSABS 128 03/11/2019    BMET Lab Results  Component Value Date   NA 135 02/05/2020   K 4.1 02/05/2020   CL 103 02/05/2020   CO2 21 (L) 02/05/2020   GLUCOSE 83 02/05/2020   BUN 12 02/05/2020   CREATININE 0.96 02/05/2020   CALCIUM 8.8 (L) 02/05/2020   GFRNONAA >60 02/05/2020   GFRAA >60 02/05/2020      Assessment and Plan  HIV disease, poorly controlled= will check annual HIV labs plus genotype to see if new mutations. For now we will restart on his previous regimen of genvoya plus darunavir. will provide refills and did adherence counseling for 15 min to discuss importance of becoming undetectable  Long term medication use = will check Cr to ensure at baseline. No side effects from ART  Renew paperwork for disability  Unintentional weight loss = likely due to being off of hiv treatment, we will see back in 4-6 wk to see if improved.  Health maintenance= will check for RPR, STI, and lipid profile  Covid-19 -- has had covid last year, but not had the vaccine. Will do vaccine later

## 2022-01-25 LAB — URINE CYTOLOGY ANCILLARY ONLY
Chlamydia: NEGATIVE
Comment: NEGATIVE
Comment: NORMAL
Neisseria Gonorrhea: NEGATIVE

## 2022-01-25 LAB — T-HELPER CELL (CD4) - (RCID CLINIC ONLY)
CD4 % Helper T Cell: 25 % — ABNORMAL LOW (ref 33–65)
CD4 T Cell Abs: 496 /uL (ref 400–1790)

## 2022-02-01 ENCOUNTER — Telehealth: Payer: Self-pay

## 2022-02-01 NOTE — Telephone Encounter (Signed)
Patient here to pick up social security forms. Went over forms with patient and explained that he needs to fill this out himself or have a friend or family member assist, but that there are no sections on the forms that need completion by a healthcare provider.   Asked patient if he needed assistance completing forms, he politely declines. Provided him with address, phone, and fax of [facility] for that section if his forms. Patient verbalized understanding and has no further questions.   Sandie Ano, RN

## 2022-02-06 LAB — COMPLETE METABOLIC PANEL WITH GFR
AG Ratio: 1 (calc) (ref 1.0–2.5)
ALT: 23 U/L (ref 9–46)
AST: 23 U/L (ref 10–35)
Albumin: 3.6 g/dL (ref 3.6–5.1)
Alkaline phosphatase (APISO): 108 U/L (ref 35–144)
BUN: 16 mg/dL (ref 7–25)
CO2: 32 mmol/L (ref 20–32)
Calcium: 9.4 mg/dL (ref 8.6–10.3)
Chloride: 103 mmol/L (ref 98–110)
Creat: 1.02 mg/dL (ref 0.70–1.30)
Globulin: 3.7 g/dL (calc) (ref 1.9–3.7)
Glucose, Bld: 72 mg/dL (ref 65–99)
Potassium: 4.5 mmol/L (ref 3.5–5.3)
Sodium: 138 mmol/L (ref 135–146)
Total Bilirubin: 0.2 mg/dL (ref 0.2–1.2)
Total Protein: 7.3 g/dL (ref 6.1–8.1)
eGFR: 88 mL/min/{1.73_m2} (ref >=60)

## 2022-02-06 LAB — HEPATITIS C ANTIBODY
Hepatitis C Ab: NONREACTIVE
SIGNAL TO CUT-OFF: 0.22 (ref <1.00)

## 2022-02-06 LAB — CBC WITH DIFFERENTIAL/PLATELET
Absolute Monocytes: 350 cells/uL (ref 200–950)
Basophils Absolute: 38 cells/uL (ref 0–200)
Basophils Relative: 0.8 %
Eosinophils Absolute: 91 cells/uL (ref 15–500)
Eosinophils Relative: 1.9 %
HCT: 45.7 % (ref 38.5–50.0)
Hemoglobin: 15.4 g/dL (ref 13.2–17.1)
Lymphs Abs: 2213 cells/uL (ref 850–3900)
MCH: 33.6 pg — ABNORMAL HIGH (ref 27.0–33.0)
MCHC: 33.7 g/dL (ref 32.0–36.0)
MCV: 99.6 fL (ref 80.0–100.0)
MPV: 10.1 fL (ref 7.5–12.5)
Monocytes Relative: 7.3 %
Neutro Abs: 2107 cells/uL (ref 1500–7800)
Neutrophils Relative %: 43.9 %
Platelets: 287 10*3/uL (ref 140–400)
RBC: 4.59 10*6/uL (ref 4.20–5.80)
RDW: 13.8 % (ref 11.0–15.0)
Total Lymphocyte: 46.1 %
WBC: 4.8 10*3/uL (ref 3.8–10.8)

## 2022-02-06 LAB — RPR: RPR Ser Ql: NONREACTIVE

## 2022-02-06 LAB — HIV-1 GENOTYPE: HIV-1 Genotype: DETECTED — AB

## 2022-02-06 LAB — HIV-1 RNA ULTRAQUANT REFLEX TO GENTYP+
HIV 1 RNA Quant: 32900 copies/mL — ABNORMAL HIGH
HIV-1 RNA Quant, Log: 4.52 Log copies/mL — ABNORMAL HIGH

## 2022-03-18 ENCOUNTER — Ambulatory Visit: Payer: Self-pay | Admitting: Internal Medicine

## 2022-03-21 ENCOUNTER — Ambulatory Visit (INDEPENDENT_AMBULATORY_CARE_PROVIDER_SITE_OTHER): Payer: Self-pay | Admitting: Internal Medicine

## 2022-03-21 ENCOUNTER — Encounter: Payer: Self-pay | Admitting: Internal Medicine

## 2022-03-21 ENCOUNTER — Other Ambulatory Visit: Payer: Self-pay

## 2022-03-21 VITALS — BP 101/64 | HR 67 | Temp 97.6°F | Ht 70.0 in | Wt 157.0 lb

## 2022-03-21 DIAGNOSIS — Z0000 Encounter for general adult medical examination without abnormal findings: Secondary | ICD-10-CM

## 2022-03-21 DIAGNOSIS — B20 Human immunodeficiency virus [HIV] disease: Secondary | ICD-10-CM

## 2022-03-21 DIAGNOSIS — Z79899 Other long term (current) drug therapy: Secondary | ICD-10-CM

## 2022-03-21 NOTE — Progress Notes (Signed)
RFV: follow up for hiv disease  Patient ID: Michael [name], male   DOB: 10/05/1965, 56 y.o.   MRN: [id number]  HPI Michael [name] is a 56yo M with well controlled hiv disease, CD 4 count  496 and VL 32,900 ( in may), off of meds x 2 yrs and genvoya-DRV for the past 4 weeks. Not missed any doses.   Has sore throat, sleeps in from sleeping in front a fan  Lives with his mom x 4 weeks.  Left sided LN/parotitis has decreased  Has increased some of his weight - subjective  Outpatient Encounter Medications as of 03/21/2022  Medication Sig   darunavir (PREZISTA) 800 MG tablet Take 1 tablet (800 mg total) by mouth daily.   elvitegravir-cobicistat-emtricitabine-tenofovir (GENVOYA) 150-150-200-10 MG TABS tablet Take 1 tablet by mouth daily with breakfast.   No facility-administered encounter medications on file as of 03/21/2022.     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   Severe malnutrition ([facility]) 07/09/2012   Epididymitis, right 07/08/2012   Leukocytosis 07/08/2012   Contact dermatitis 10/21/2011   Periodontal disease 10/21/2011   Warts 10/10/2011   Underweight 03/22/2011   DENTAL CARIES 01/19/2009   HIV DISEASE 12/05/2008   ACUTE GINGIVITIS PLAQUE INDUCED 12/05/2008   ASEPTIC NECROSIS, FEMUR HEAD/NECK 12/05/2008   PERSONAL HISTORY OF TRAUMATIC BRAIN INJURY 12/05/2008     Health Maintenance Due  Topic Date Due   COVID-19 Vaccine (1) Never done   Zoster Vaccines- Shingrix (1 of 2) Never done   COLONOSCOPY (Pts 45-67yrs Insurance coverage will need to be confirmed)  Never done     Review of Systems 12 poin ros is negative except for what is mentioned above. Physical Exam   BP 101/64   Pulse 67   Temp 97.6 F (36.4 C) (Oral)   Ht 5\' 10"  (1.778 m)   Wt 157 lb (71.2 kg)   BMI 22.53  kg/m   Physical Exam  Constitutional: He is oriented to person, place, and time. He appears well-developed and well-nourished. No distress.  HENT:  Mouth/Throat: Oropharynx is clear and moist. No oropharyngeal exudate.  Cardiovascular: Normal rate, regular rhythm and normal heart sounds. Exam reveals no gallop and no friction rub.  No murmur heard.  Pulmonary/Chest: Effort normal and breath sounds normal. No respiratory distress. He has no wheezes.  Lymphadenopathy:  He has no cervical adenopathy.  Neurological: He is alert and oriented to person, place, and time.  Skin: Skin is warm and dry. No rash noted. No erythema.  Psychiatric: He has a normal mood and affect. His behavior is normal.   Lab Results  Component Value Date   CD4TCELL 25 (L) 01/24/2022   Lab Results  Component Value Date   CD4TABS 496 01/24/2022   CD4TABS 1,365 03/16/2019   CD4TABS 640 04/06/2018   Lab Results  Component Value Date   HIV1RNAQUANT 32,900 (H) 01/24/2022   Lab Results  Component Value Date   HEPBSAB NEG 01/11/2016   Lab Results  Component Value Date   LABRPR NON-REACTIVE 01/24/2022    CBC Lab Results  Component Value Date   WBC 4.8 01/24/2022   RBC 4.59 01/24/2022   HGB 15.4 01/24/2022   HCT 45.7 01/24/2022   PLT 287 01/24/2022   MCV 99.6 01/24/2022   MCH 33.6 (H) 01/24/2022  MCHC 33.7 01/24/2022   RDW 13.8 01/24/2022   LYMPHSABS 2,213 01/24/2022   MONOABS 1.1 (H) 08/18/2016   EOSABS 91 01/24/2022    BMET Lab Results  Component Value Date   NA 138 01/24/2022   K 4.5 01/24/2022   CL 103 01/24/2022   CO2 32 01/24/2022   GLUCOSE 72 01/24/2022   BUN 16 01/24/2022   CREATININE 1.02 01/24/2022   CALCIUM 9.4 01/24/2022   GFRNONAA >60 02/05/2020   GFRAA >60 02/05/2020      Assessment and Plan  Hiv disease= repeat labs to see if undetectable. Plan to continue on genvoya-DRV  Long term medication management = will check cr  Health maintenance = will check hep b  surface ab. Starting to get medicaid - if so, we will do cologuard.

## 2022-03-22 LAB — T-HELPER CELL (CD4) - (RCID CLINIC ONLY)
CD4 % Helper T Cell: 26 % — ABNORMAL LOW (ref 33–65)
CD4 T Cell Abs: 768 /uL (ref 400–1790)

## 2022-03-25 LAB — HEPATITIS B SURFACE ANTIBODY,QUALITATIVE: Hep B S Ab: NONREACTIVE

## 2022-03-25 LAB — HIV-1 RNA QUANT-NO REFLEX-BLD
HIV 1 RNA Quant: <20 Copies/mL — ABNORMAL HIGH
HIV-1 RNA Quant, Log: <1.3 Log cps/mL — ABNORMAL HIGH

## 2022-05-07 ENCOUNTER — Other Ambulatory Visit (HOSPITAL_COMMUNITY): Payer: Self-pay

## 2022-06-10 ENCOUNTER — Ambulatory Visit: Payer: Self-pay | Admitting: Internal Medicine

## 2023-02-10 ENCOUNTER — Other Ambulatory Visit: Payer: Self-pay | Admitting: Internal Medicine

## 2023-02-10 ENCOUNTER — Other Ambulatory Visit: Payer: Self-pay

## 2023-02-10 DIAGNOSIS — B20 Human immunodeficiency virus [HIV] disease: Secondary | ICD-10-CM

## 2023-02-10 MED ORDER — GENVOYA 150-150-200-10 MG PO TABS: 1.0000 | ORAL_TABLET | Freq: Every day | ORAL | 0 refills | Status: DC

## 2023-02-10 MED ORDER — DARUNAVIR 800 MG PO TABS: 800.0000 mg | ORAL_TABLET | Freq: Every day | ORAL | 0 refills | Status: DC

## 2023-02-17 ENCOUNTER — Telehealth: Payer: Self-pay

## 2023-02-17 NOTE — Telephone Encounter (Signed)
-----   Message from [place] sent at 02/10/2023 12:01 PM EDT ----- Call could not be completed, no MyChart   ----- Message ----- From: Sandie Ano, RN Sent: 02/10/2023  11:17 AM EDT To: Sheran Spine; Lucrezia Europe; Marin Shutter  I sent in 30 days of meds for him, but could one of you reach out to see about getting him scheduled with Dr. Drue Second? Thank you!

## 2023-02-17 NOTE — Telephone Encounter (Signed)
Phone number no longer is service, needs a follow up appointment with Dr. Drue Second

## 2023-03-08 ENCOUNTER — Other Ambulatory Visit: Payer: Self-pay | Admitting: Internal Medicine

## 2023-03-08 DIAGNOSIS — B20 Human immunodeficiency virus [HIV] disease: Secondary | ICD-10-CM

## 2023-03-10 ENCOUNTER — Other Ambulatory Visit: Payer: Self-pay | Admitting: Internal Medicine

## 2023-03-10 DIAGNOSIS — B20 Human immunodeficiency virus [HIV] disease: Secondary | ICD-10-CM

## 2023-04-04 ENCOUNTER — Other Ambulatory Visit: Payer: Self-pay | Admitting: Internal Medicine

## 2023-04-04 DIAGNOSIS — B20 Human immunodeficiency virus [HIV] disease: Secondary | ICD-10-CM

## 2023-04-07 ENCOUNTER — Other Ambulatory Visit: Payer: Self-pay | Admitting: Internal Medicine

## 2023-04-07 ENCOUNTER — Telehealth: Payer: Self-pay

## 2023-04-07 DIAGNOSIS — B20 Human immunodeficiency virus [HIV] disease: Secondary | ICD-10-CM

## 2023-04-07 NOTE — Telephone Encounter (Signed)
Attempted to call patient regarding refill request for medication. Is past due for follow up. Will need to be seen for refills. Call cannot be completed.  Juanita Laster, RMA

## 2023-04-15 ENCOUNTER — Other Ambulatory Visit: Payer: Self-pay | Admitting: Internal Medicine

## 2023-04-15 DIAGNOSIS — B20 Human immunodeficiency virus [HIV] disease: Secondary | ICD-10-CM

## 2023-04-23 ENCOUNTER — Ambulatory Visit (INDEPENDENT_AMBULATORY_CARE_PROVIDER_SITE_OTHER): Payer: Medicare HMO | Admitting: Internal Medicine

## 2023-04-23 ENCOUNTER — Other Ambulatory Visit: Payer: Self-pay

## 2023-04-23 ENCOUNTER — Encounter: Payer: Self-pay | Admitting: Internal Medicine

## 2023-04-23 ENCOUNTER — Other Ambulatory Visit (HOSPITAL_COMMUNITY)
Admission: RE | Admit: 2023-04-23 | Discharge: 2023-04-23 | Disposition: A | Discharge status: Home / Self Care | Payer: Medicare HMO | Source: Ambulatory Visit | Attending: Internal Medicine | Admitting: Internal Medicine

## 2023-04-23 VITALS — BP 153/93 | HR 60 | Temp 97.8°F | Ht 69.0 in | Wt 162.0 lb

## 2023-04-23 DIAGNOSIS — I10 Essential (primary) hypertension: Secondary | ICD-10-CM

## 2023-04-23 DIAGNOSIS — B20 Human immunodeficiency virus [HIV] disease: Secondary | ICD-10-CM

## 2023-04-23 DIAGNOSIS — Z79899 Other long term (current) drug therapy: Secondary | ICD-10-CM

## 2023-04-23 MED ORDER — DARUNAVIR 800 MG PO TABS
800.0000 mg | ORAL_TABLET | Freq: Every day | ORAL | 11 refills | Status: DC
Start: 2023-04-23 — End: 2023-04-23

## 2023-04-23 MED ORDER — GENVOYA 150-150-200-10 MG PO TABS
1.0000 | ORAL_TABLET | Freq: Every day | ORAL | 11 refills | Status: DC
Start: 2023-04-23 — End: 2023-04-23

## 2023-04-23 MED ORDER — DARUNAVIR 800 MG PO TABS: 800.0000 mg | ORAL_TABLET | Freq: Every day | ORAL | 11 refills | Status: AC

## 2023-04-23 MED ORDER — GENVOYA 150-150-200-10 MG PO TABS: 1.0000 | ORAL_TABLET | Freq: Every day | ORAL | 11 refills | Status: AC

## 2023-04-23 NOTE — Progress Notes (Signed)
RFV: follow up for hiv disease,  Patient ID: Michael [name], male   DOB: 05/19/1966, 57 y.o.   MRN: [id number]  HPI Michael [name] is a 57yo M with HIV disease, currently on genvoya-DRV. He reports that he has Lost his mom  since we last saw him. Ran out 5 days ago but was taking every day.  May lose the house he is living in due to financial difficulty - under foreclosure  Outpatient Encounter Medications as of 04/23/2023  Medication Sig   darunavir (PREZISTA) 800 MG tablet TAKE 1 TABLET(800 MG) BY MOUTH DAILY. MAKE AN APPOINTMENT   GENVOYA 150-150-200-10 MG TABS tablet TAKE 1 TABLET BY MOUTH DAILY WITH BREAKFAST. MUST MAKE AN APPOINTMENT FOR FURTHER REFILLS.   No facility-administered encounter medications on file as of 04/23/2023.     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   Severe malnutrition ([facility]) 07/09/2012   Epididymitis, right 07/08/2012   Leukocytosis 07/08/2012   Contact dermatitis 10/21/2011   Periodontal disease 10/21/2011   Warts 10/10/2011   Underweight 03/22/2011   DENTAL CARIES 01/19/2009   HIV DISEASE 12/05/2008   ACUTE GINGIVITIS PLAQUE INDUCED 12/05/2008   ASEPTIC NECROSIS, FEMUR HEAD/NECK 12/05/2008   PERSONAL HISTORY OF TRAUMATIC BRAIN INJURY 12/05/2008     Health Maintenance Due  Topic Date Due   Medicare Annual Wellness (AWV)  Never done   COVID-19 Vaccine (1) Never done   Zoster Vaccines- Shingrix (1 of 2) Never done   Colonoscopy  Never done   INFLUENZA VACCINE  04/10/2023     Review of Systems +grief adjustment. Stress+/ 12 point ROS is otherwise negative Physical Exam   BP (!) 153/93   Pulse 60   Temp 97.8 F (36.6 C) (Temporal)   Ht 5\' 9"  (1.753 m)   Wt 162 lb (73.5 kg)   SpO2 99%   BMI 23.92 kg/m   Physical Exam  Constitutional: He is  oriented to person, place, and time. He appears well-developed and well-nourished. No distress.  HENT:  Mouth/Throat: Oropharynx is clear and moist. No oropharyngeal exudate.  Cardiovascular: Normal rate, regular rhythm and normal heart sounds. Exam reveals no gallop and no friction rub.  No murmur heard.  Pulmonary/Chest: Effort normal and breath sounds normal. No respiratory distress. He has no wheezes.  Abdominal: Soft. Bowel sounds are normal. He exhibits no distension. There is no tenderness.  Lymphadenopathy:  He has no cervical adenopathy.  Neurological: He is alert and oriented to person, place, and time.  Skin: Skin is warm and dry. No rash noted. No erythema.  Psychiatric: He has a normal mood and affect. His behavior is normal.   Lab Results  Component Value Date   CD4TCELL 26 (L) 03/21/2022   Lab Results  Component Value Date   CD4TABS 768 03/21/2022   CD4TABS 496 01/24/2022   CD4TABS 1,365 03/16/2019   Lab Results  Component Value Date   HIV1RNAQUANT <20 (H) 03/21/2022   Lab Results  Component Value Date   HEPBSAB NON-REACTIVE 03/21/2022   Lab Results  Component Value Date   LABRPR NON-REACTIVE 01/24/2022    CBC Lab Results  Component Value Date   WBC 4.8 01/24/2022   RBC 4.59 01/24/2022   HGB 15.4 01/24/2022   HCT 45.7 01/24/2022   PLT 287 01/24/2022   MCV  99.6 01/24/2022   MCH 33.6 (H) 01/24/2022   MCHC 33.7 01/24/2022   RDW 13.8 01/24/2022   LYMPHSABS 2,213 01/24/2022   MONOABS 1.1 (H) 08/18/2016   EOSABS 91 01/24/2022    BMET Lab Results  Component Value Date   NA 138 01/24/2022   K 4.5 01/24/2022   CL 103 01/24/2022   CO2 32 01/24/2022   GLUCOSE 72 01/24/2022   BUN 16 01/24/2022   CREATININE 1.02 01/24/2022   CALCIUM 9.4 01/24/2022   GFRNONAA >60 02/05/2020   GFRAA >60 02/05/2020      Assessment and Plan  Htn = repeat bp; if still elevated will need to repeat at next visit - to see if need to start anti-hypertensive  Long  term medication = will check cr to see still at baseline  Hiv disease = refill meds and get labs to see that he is under good virologic control  Health maintenance = will check STI

## 2023-04-24 LAB — T-HELPER CELL (CD4) - (RCID CLINIC ONLY)
CD4 % Helper T Cell: 33 % (ref 33–65)
CD4 T Cell Abs: 868 /uL (ref 400–1790)

## 2023-04-24 LAB — URINE CYTOLOGY ANCILLARY ONLY
Chlamydia: NEGATIVE
Comment: NEGATIVE
Comment: NORMAL
Neisseria Gonorrhea: NEGATIVE

## 2023-04-25 LAB — CBC WITH DIFFERENTIAL/PLATELET
Absolute Monocytes: 632 {cells}/uL (ref 200–950)
Basophils Absolute: 53 {cells}/uL (ref 0–200)
Basophils Relative: 0.6 %
Eosinophils Absolute: 142 {cells}/uL (ref 15–500)
Eosinophils Relative: 1.6 %
HCT: 47 % (ref 38.5–50.0)
Hemoglobin: 16.3 g/dL (ref 13.2–17.1)
Lymphs Abs: 3026 {cells}/uL (ref 850–3900)
MCH: 34.7 pg — ABNORMAL HIGH (ref 27.0–33.0)
MCHC: 34.7 g/dL (ref 32.0–36.0)
MCV: 100 fL (ref 80.0–100.0)
MPV: 10.3 fL (ref 7.5–12.5)
Monocytes Relative: 7.1 %
Neutro Abs: 5046 {cells}/uL (ref 1500–7800)
Neutrophils Relative %: 56.7 %
Platelets: 274 10*3/uL (ref 140–400)
RBC: 4.7 10*6/uL (ref 4.20–5.80)
RDW: 14 % (ref 11.0–15.0)
Total Lymphocyte: 34 %
WBC: 8.9 10*3/uL (ref 3.8–10.8)

## 2023-04-25 LAB — COMPLETE METABOLIC PANEL WITH GFR
AG Ratio: 1.4 (calc) (ref 1.0–2.5)
ALT: 9 U/L (ref 9–46)
AST: 13 U/L (ref 10–35)
Albumin: 4.2 g/dL (ref 3.6–5.1)
Alkaline phosphatase (APISO): 93 U/L (ref 35–144)
BUN: 16 mg/dL (ref 7–25)
CO2: 30 mmol/L (ref 20–32)
Calcium: 9.2 mg/dL (ref 8.6–10.3)
Chloride: 104 mmol/L (ref 98–110)
Creat: 0.96 mg/dL (ref 0.70–1.30)
Globulin: 3 g/dL (ref 1.9–3.7)
Glucose, Bld: 73 mg/dL (ref 65–99)
Potassium: 4.4 mmol/L (ref 3.5–5.3)
Sodium: 140 mmol/L (ref 135–146)
Total Bilirubin: 0.3 mg/dL (ref 0.2–1.2)
Total Protein: 7.2 g/dL (ref 6.1–8.1)
eGFR: 94 mL/min/{1.73_m2} (ref >=60)

## 2023-04-25 LAB — HIV-1 RNA QUANT-NO REFLEX-BLD
HIV 1 RNA Quant: 78 {copies}/mL — ABNORMAL HIGH
HIV-1 RNA Quant, Log: 1.89 {Log_copies}/mL — ABNORMAL HIGH

## 2023-04-25 LAB — RPR: RPR Ser Ql: NONREACTIVE

## 2023-05-29 ENCOUNTER — Ambulatory Visit: Payer: Medicare HMO | Admitting: Internal Medicine

## 2023-06-23 ENCOUNTER — Ambulatory Visit: Payer: Medicare HMO | Admitting: Internal Medicine

## 2023-06-23 ENCOUNTER — Telehealth: Payer: Self-pay

## 2023-06-23 NOTE — Telephone Encounter (Signed)
Called regarding missed appointment today. Left voicemail requesting call back  Juanita Laster, RMA

## 2023-08-26 ENCOUNTER — Emergency Department
Admission: EM | Admit: 2023-08-26 | Discharge: 2023-08-26 | Disposition: A | Discharge status: Home / Self Care | Payer: Medicare HMO | Attending: Emergency Medicine | Admitting: Emergency Medicine

## 2023-08-26 ENCOUNTER — Other Ambulatory Visit: Payer: Self-pay

## 2023-08-26 ENCOUNTER — Emergency Department: Payer: Medicare HMO

## 2023-08-26 DIAGNOSIS — Z0279 Encounter for issue of other medical certificate: Secondary | ICD-10-CM | POA: Insufficient documentation

## 2023-08-26 DIAGNOSIS — Z139 Encounter for screening, unspecified: Secondary | ICD-10-CM

## 2023-08-26 DIAGNOSIS — R059 Cough, unspecified: Secondary | ICD-10-CM | POA: Diagnosis not present

## 2023-08-26 DIAGNOSIS — Z0289 Encounter for other administrative examinations: Secondary | ICD-10-CM | POA: Diagnosis not present

## 2023-08-26 DIAGNOSIS — Z20822 Contact with and (suspected) exposure to covid-19: Secondary | ICD-10-CM | POA: Insufficient documentation

## 2023-08-26 LAB — CBC WITH DIFFERENTIAL/PLATELET
Abs Immature Granulocytes: 0.05 10*3/uL (ref 0.00–0.07)
Basophils Absolute: 0.1 10*3/uL (ref 0.0–0.1)
Basophils Relative: 1 %
Eosinophils Absolute: 0.1 10*3/uL (ref 0.0–0.5)
Eosinophils Relative: 2 %
HCT: 43.4 % (ref 39.0–52.0)
Hemoglobin: 15.2 g/dL (ref 13.0–17.0)
Immature Granulocytes: 1 %
Lymphocytes Relative: 29 %
Lymphs Abs: 2.2 10*3/uL (ref 0.7–4.0)
MCH: 34.5 pg — ABNORMAL HIGH (ref 26.0–34.0)
MCHC: 35 g/dL (ref 30.0–36.0)
MCV: 98.6 fL (ref 80.0–100.0)
Monocytes Absolute: 0.5 10*3/uL (ref 0.1–1.0)
Monocytes Relative: 7 %
Neutro Abs: 4.8 10*3/uL (ref 1.7–7.7)
Neutrophils Relative %: 60 %
Platelets: 237 10*3/uL (ref 150–400)
RBC: 4.4 MIL/uL (ref 4.22–5.81)
RDW: 14.7 % (ref 11.5–15.5)
WBC: 7.8 10*3/uL (ref 4.0–10.5)
nRBC: 0 % (ref 0.0–0.2)

## 2023-08-26 LAB — URINALYSIS, ROUTINE W REFLEX MICROSCOPIC
Bilirubin Urine: NEGATIVE
Glucose, UA: NEGATIVE mg/dL
Hgb urine dipstick: NEGATIVE
Ketones, ur: NEGATIVE mg/dL
Leukocytes,Ua: NEGATIVE
Nitrite: NEGATIVE
Protein, ur: NEGATIVE mg/dL
Specific Gravity, Urine: 1.009 (ref 1.005–1.030)
pH: 7 (ref 5.0–8.0)

## 2023-08-26 LAB — COMPREHENSIVE METABOLIC PANEL
ALT: 16 U/L (ref 0–44)
AST: 17 U/L (ref 15–41)
Albumin: 4 g/dL (ref 3.5–5.0)
Alkaline Phosphatase: 84 U/L (ref 38–126)
Anion gap: 8 (ref 5–15)
BUN: 18 mg/dL (ref 6–20)
CO2: 25 mmol/L (ref 22–32)
Calcium: 9.2 mg/dL (ref 8.9–10.3)
Chloride: 102 mmol/L (ref 98–111)
Creatinine, Ser: 0.83 mg/dL (ref 0.61–1.24)
GFR, Estimated: >60 mL/min (ref >60)
Glucose, Bld: 90 mg/dL (ref 70–99)
Potassium: 3.8 mmol/L (ref 3.5–5.1)
Sodium: 135 mmol/L (ref 135–145)
Total Bilirubin: 0.4 mg/dL (ref <1.2)
Total Protein: 7.6 g/dL (ref 6.5–8.1)

## 2023-08-26 LAB — RESP PANEL BY RT-PCR (RSV, FLU A&B, COVID)  RVPGX2
Influenza A by PCR: NEGATIVE
Influenza B by PCR: NEGATIVE
Resp Syncytial Virus by PCR: NEGATIVE
SARS Coronavirus 2 by RT PCR: NEGATIVE

## 2023-08-26 NOTE — ED Provider Notes (Signed)
[facility] Provider Note    Event Date/Time   First MD Initiated Contact with Patient 08/26/23 0122     (approximate)   History   Medical Clearance   HPI  Michael [name] is a 57 y.o. male who presents to the ED for evaluation of Medical Clearance   Patient presents to the ED for evaluation of medical clearance for incarceration.  He presents with Patent examiner.  He reports he threw up 2 or 3 days ago.  Reports his kidneys or his liver might be messed up.  No abdominal pain, he has been tolerating p.o. intake today   Physical Exam   Triage Vital Signs: ED Triage Vitals  Encounter Vitals Group     BP 08/26/23 0040 124/83     Systolic BP Percentile --      Diastolic BP Percentile --      Pulse Rate 08/26/23 0040 75     Resp 08/26/23 0040 16     Temp 08/26/23 0040 98.4 F (36.9 C)     Temp Source 08/26/23 0040 Oral     SpO2 08/26/23 0040 100 %     Weight 08/26/23 0038 151 lb (68.5 kg)     Height 08/26/23 0038 5\' 9"  (1.753 m)     Head Circumference --      Peak Flow --      Pain Score 08/26/23 0038 7     Pain Loc --      Pain Education --      Exclude from Growth Chart --     Most recent vital signs: Vitals:   08/26/23 0040  BP: 124/83  Pulse: 75  Resp: 16  Temp: 98.4 F (36.9 C)  SpO2: 100%    General: Awake, no distress.  Well-appearing CV:  Good peripheral perfusion.  Resp:  Normal effort.  Abd:  No distention.  Soft and benign throughout MSK:  No deformity noted.  Neuro:  No focal deficits appreciated. Other:     ED Results / Procedures / Treatments   Labs (all labs ordered are listed, but only abnormal results are displayed) Labs Reviewed  CBC WITH DIFFERENTIAL/PLATELET - Abnormal; Notable for the following components:      Result Value   MCH 34.5 (*)    All other components within normal limits  URINALYSIS, ROUTINE W REFLEX MICROSCOPIC - Abnormal; Notable for the following components:   Color, Urine YELLOW (*)     APPearance CLEAR (*)    All other components within normal limits  RESP PANEL BY RT-PCR (RSV, FLU A&B, COVID)  [id number]  COMPREHENSIVE METABOLIC PANEL    EKG   RADIOLOGY CXR interpreted by me without evidence of acute cardiopulmonary pathology.  Official radiology report(s): No results found.  PROCEDURES and INTERVENTIONS:  Procedures  Medications - No data to display   IMPRESSION / MDM / ASSESSMENT AND PLAN / ED COURSE  I reviewed the triage vital signs and the nursing notes.  Differential diagnosis includes, but is not limited to, malingering, UTI, AKI, appendicitis  {Patient presents with symptoms of an acute illness or injury that is potentially life-threatening.  Patient presents for medical clearance for jail.  No evidence of acute medical pathology to preclude outpatient management.  Normal CBC, metabolic panel and urinalysis.  Clear CXR.  Normal vital signs and exam.      FINAL CLINICAL IMPRESSION(S) / ED DIAGNOSES   Final diagnoses:  Encounter for medical screening examination     Rx /  DC Orders   ED Discharge Orders     None        Note:  This document was prepared using Dragon voice recognition software and may include unintentional dictation errors.   [place], MD 08/26/23 [phone]

## 2023-08-26 NOTE — ED Triage Notes (Signed)
Pt to ED via [facility], pt is here for medical clearance. Pt reports feeling "sick" and needing blood work to check chronic kidney and liver conditions.
